# Patient Record
Sex: Female | Born: 1980 | Race: White | Hispanic: No | Marital: Married | Smoking: Never smoker
Health system: Southern US, Community
[De-identification: ages and names within clinical notes are randomized; demographics above are authoritative.]

## PROBLEM LIST (undated history)

## (undated) ENCOUNTER — Inpatient Hospital Stay: Payer: Self-pay

## (undated) DIAGNOSIS — D649 Anemia, unspecified: Secondary | ICD-10-CM

## (undated) DIAGNOSIS — N809 Endometriosis, unspecified: Secondary | ICD-10-CM

## (undated) DIAGNOSIS — K219 Gastro-esophageal reflux disease without esophagitis: Secondary | ICD-10-CM

## (undated) DIAGNOSIS — N83209 Unspecified ovarian cyst, unspecified side: Secondary | ICD-10-CM

## (undated) DIAGNOSIS — N946 Dysmenorrhea, unspecified: Secondary | ICD-10-CM

## (undated) DIAGNOSIS — N939 Abnormal uterine and vaginal bleeding, unspecified: Secondary | ICD-10-CM

## (undated) HISTORY — PX: TONSILLECTOMY AND ADENOIDECTOMY: SUR1326

## (undated) HISTORY — PX: NASAL TURBINATE REDUCTION: SHX2072

---

## 2003-12-27 ENCOUNTER — Other Ambulatory Visit: Payer: Self-pay

## 2004-04-05 ENCOUNTER — Emergency Department: Payer: Self-pay | Admitting: General Practice

## 2004-04-23 ENCOUNTER — Emergency Department: Payer: Self-pay | Admitting: General Practice

## 2004-12-20 ENCOUNTER — Ambulatory Visit: Payer: Self-pay | Admitting: Otolaryngology

## 2005-06-10 ENCOUNTER — Emergency Department: Payer: Self-pay | Admitting: Emergency Medicine

## 2005-07-11 ENCOUNTER — Ambulatory Visit: Payer: Self-pay | Admitting: Otolaryngology

## 2005-08-29 ENCOUNTER — Emergency Department: Payer: Self-pay | Admitting: Emergency Medicine

## 2005-09-14 ENCOUNTER — Emergency Department: Payer: Self-pay | Admitting: Emergency Medicine

## 2005-09-26 ENCOUNTER — Emergency Department: Payer: Self-pay | Admitting: Emergency Medicine

## 2006-01-31 ENCOUNTER — Emergency Department: Payer: Self-pay | Admitting: Emergency Medicine

## 2006-01-31 ENCOUNTER — Other Ambulatory Visit: Payer: Self-pay

## 2006-06-14 ENCOUNTER — Ambulatory Visit: Payer: Self-pay

## 2006-06-26 ENCOUNTER — Observation Stay: Payer: Self-pay

## 2006-08-07 ENCOUNTER — Inpatient Hospital Stay: Payer: Self-pay

## 2006-09-14 ENCOUNTER — Emergency Department: Payer: Self-pay | Admitting: Emergency Medicine

## 2008-04-14 ENCOUNTER — Emergency Department: Payer: Self-pay

## 2008-09-07 ENCOUNTER — Emergency Department: Payer: Self-pay | Admitting: Emergency Medicine

## 2009-03-15 HISTORY — PX: TUBAL LIGATION: SHX77

## 2009-03-24 ENCOUNTER — Ambulatory Visit: Payer: Self-pay

## 2009-03-25 ENCOUNTER — Inpatient Hospital Stay: Payer: Self-pay

## 2009-12-28 ENCOUNTER — Emergency Department: Payer: Self-pay | Admitting: Emergency Medicine

## 2010-08-07 ENCOUNTER — Emergency Department: Payer: Self-pay | Admitting: Emergency Medicine

## 2010-11-28 ENCOUNTER — Emergency Department: Payer: Self-pay | Admitting: Emergency Medicine

## 2011-02-22 ENCOUNTER — Emergency Department: Payer: Self-pay | Admitting: Emergency Medicine

## 2011-09-15 ENCOUNTER — Emergency Department: Payer: Self-pay | Admitting: Emergency Medicine

## 2012-01-27 ENCOUNTER — Emergency Department: Payer: Self-pay | Admitting: Emergency Medicine

## 2012-09-20 ENCOUNTER — Emergency Department: Payer: Self-pay | Admitting: Unknown Physician Specialty

## 2013-05-20 ENCOUNTER — Emergency Department: Payer: Self-pay | Admitting: Emergency Medicine

## 2016-03-27 ENCOUNTER — Emergency Department
Admission: EM | Admit: 2016-03-27 | Discharge: 2016-03-27 | Disposition: A | Discharge status: Home / Self Care | Payer: Self-pay | Attending: Emergency Medicine | Admitting: Emergency Medicine

## 2016-03-27 ENCOUNTER — Encounter: Payer: Self-pay | Admitting: Emergency Medicine

## 2016-03-27 ENCOUNTER — Emergency Department: Payer: Self-pay

## 2016-03-27 DIAGNOSIS — Y929 Unspecified place or not applicable: Secondary | ICD-10-CM | POA: Insufficient documentation

## 2016-03-27 DIAGNOSIS — Y9301 Activity, walking, marching and hiking: Secondary | ICD-10-CM | POA: Insufficient documentation

## 2016-03-27 DIAGNOSIS — W108XXA Fall (on) (from) other stairs and steps, initial encounter: Secondary | ICD-10-CM | POA: Insufficient documentation

## 2016-03-27 DIAGNOSIS — Y999 Unspecified external cause status: Secondary | ICD-10-CM | POA: Insufficient documentation

## 2016-03-27 DIAGNOSIS — S92355A Nondisplaced fracture of fifth metatarsal bone, left foot, initial encounter for closed fracture: Secondary | ICD-10-CM | POA: Insufficient documentation

## 2016-03-27 MED ORDER — MELOXICAM 15 MG PO TABS: 15.0000 mg | ORAL_TABLET | Freq: Every day | ORAL | 0 refills | Status: DC

## 2016-03-27 MED ORDER — TRAMADOL HCL 50 MG PO TABS: 50.0000 mg | ORAL_TABLET | Freq: Four times a day (QID) | ORAL | 0 refills | Status: DC | PRN

## 2016-03-27 NOTE — ED Provider Notes (Signed)
[facility] Emergency Department Provider Note ____________________________________________  Time seen: Approximately 8:45 AM  I have reviewed the triage vital signs and the nursing notes.   HISTORY  Chief Complaint Foot Injury    HPI Ashlee Curtis is a 35 y.o. female who presents to the emergency department for evaluation of foot pain. While walking down her deck stairs this morning, she missed the last 2 steps with her arms full and fell sideways. Her right foot stepped on her left as she fell into her trailer to prevent falling on the wet ground. She has pain on the top of her left foot near her pinky toe. She has taken 800mg  of ibuprofen.  History reviewed. No pertinent past medical history.  There are no active problems to display for this patient.   History reviewed. No pertinent surgical history.  Prior to Admission medications   Medication Sig Start Date End Date Taking? Authorizing Provider  meloxicam (MOBIC) 15 MG tablet Take 1 tablet (15 mg total) by mouth daily. 03/27/16   Victorino Dike, FNP  traMADol (ULTRAM) 50 MG tablet Take 1 tablet (50 mg total) by mouth every 6 (six) hours as needed. 03/27/16   Victorino Dike, FNP    Allergies Sulfa antibiotics  No family history on file.  Social History Social History  Substance Use Topics  . Smoking status: Never Smoker  . Smokeless tobacco: Never Used  . Alcohol use Not on file    Review of Systems Constitutional: No recent illness. Cardiovascular: Denies chest pain or palpitations. Respiratory: Denies shortness of breath. Musculoskeletal: Pain in left foot. Skin: Negative for rash, wound, lesion. Neurological: Negative for focal weakness or numbness.  ____________________________________________   PHYSICAL EXAM:  VITAL SIGNS: ED Triage Vitals  Enc Vitals Group     BP 03/27/16 0822 140/73     Pulse Rate 03/27/16 0822 79     Resp 03/27/16 0822 18     Temp 03/27/16 0822 98 F  (36.7 C)     Temp src --      SpO2 03/27/16 0822 99 %     Weight 03/27/16 0822 270 lb (122.5 kg)     Height 03/27/16 0822 5\' 3"  (1.6 m)     Head Circumference --      Peak Flow --      Pain Score 03/27/16 0823 7     Pain Loc --      Pain Edu? --      Excl. in [place]? --     Constitutional: Alert and oriented. Well appearing and in no acute distress. Eyes: Conjunctivae are normal. EOMI. Head: Atraumatic. Neck: No stridor.  Respiratory: Normal respiratory effort.   Musculoskeletal: Tenderness over the distal 5th metatarsal. No obvious deformity. Mild swelling noted.  Neurologic:  Normal speech and language. No gross focal neurologic deficits are appreciated. Speech is normal. No gait instability. Skin:  Skin is warm, dry and intact. Atraumatic. Psychiatric: Mood and affect are normal. Speech and behavior are normal.  ____________________________________________   LABS (all labs ordered are listed, but only abnormal results are displayed)  Labs Reviewed - No data to display ____________________________________________  RADIOLOGY  Possible nondisplaced fracture at the head of the left fifth metatarsal which correlates with point tenderness. I, Sherrie George, personally viewed and evaluated these images (plain radiographs) as part of my medical decision making, as well as reviewing the written report by the radiologist.  ____________________________________________   PROCEDURES  Procedure(s) performed: ACE and Post op  shoe applied by ER tech. Neurovascularly intact post application.   ____________________________________________   INITIAL IMPRESSION / ASSESSMENT AND PLAN / ED COURSE  Clinical Course     Pertinent labs & imaging results that were available during my care of the patient were reviewed by me and considered in my medical decision making (see chart for details).  Patient written Meloxicam and Tramadol. She is to follow up with podiatry for symptoms that do  not improve over the week. She is to avoid prolonged weight bearing. She is to return to the ER for symptoms that change or worsen if unable to schedule an appointment. ____________________________________________   FINAL CLINICAL IMPRESSION(S) / ED DIAGNOSES  Final diagnoses:  Closed nondisplaced fracture of fifth metatarsal bone of left foot, initial encounter       Victorino Dike, FNP 03/27/16 [place], MD 03/27/16 [phone]

## 2016-03-27 NOTE — ED Notes (Signed)
E-signature box not working. Pt verbalized understanding of discharge instructions and denied questions. 

## 2016-03-27 NOTE — ED Triage Notes (Signed)
Fell yesterday.  Pain to left foot.  Ambulates well.

## 2016-03-27 NOTE — ED Notes (Signed)
States she fell yesterday  Missed a step  Pain and swelling noted to top of foot  Positive pulses

## 2017-06-04 ENCOUNTER — Emergency Department
Admission: EM | Admit: 2017-06-04 | Discharge: 2017-06-04 | Disposition: A | Discharge status: Home / Self Care | Payer: 59 | Attending: Emergency Medicine | Admitting: Emergency Medicine

## 2017-06-04 ENCOUNTER — Encounter: Payer: Self-pay | Admitting: Emergency Medicine

## 2017-06-04 ENCOUNTER — Other Ambulatory Visit: Payer: Self-pay

## 2017-06-04 DIAGNOSIS — R51 Headache: Secondary | ICD-10-CM | POA: Diagnosis present

## 2017-06-04 DIAGNOSIS — H9203 Otalgia, bilateral: Secondary | ICD-10-CM | POA: Diagnosis not present

## 2017-06-04 DIAGNOSIS — J0140 Acute pansinusitis, unspecified: Secondary | ICD-10-CM | POA: Diagnosis not present

## 2017-06-04 MED ORDER — PREDNISONE 10 MG PO TABS: ORAL_TABLET | ORAL | 0 refills | Status: DC

## 2017-06-04 MED ORDER — FLUTICASONE PROPIONATE 50 MCG/ACT NA SUSP: 2.0000 | Freq: Every day | NASAL | 0 refills | Status: DC

## 2017-06-04 MED ORDER — AMOXICILLIN-POT CLAVULANATE 875-125 MG PO TABS: 1.0000 | ORAL_TABLET | Freq: Two times a day (BID) | ORAL | 0 refills | Status: AC

## 2017-06-04 NOTE — Discharge Instructions (Signed)
Follow-up with Dr. Kathyrn Sheriff at [facility] if any continued problems with her sinuses.  Begin taking Augmentin 875 twice daily for 10 days.  Prednisone taper dose over the next 6 days.  Also use Flonase nasal spray 2 sprays each nostril once daily.  Increase fluids.  Tylenol or ibuprofen if needed for facial pain.

## 2017-06-04 NOTE — ED Provider Notes (Signed)
[facility] Emergency Department Provider Note  ____________________________________________   First MD Initiated Contact with Patient 06/04/17 1120     (approximate)  I have reviewed the triage vital signs and the nursing notes.   HISTORY  Chief Complaint Facial Pain and Otalgia   HPI Ashlee Curtis is a 37 y.o. female 0 complaint of sinus drainage and congestion for 1 month.  She states for the last 2 weeks that she has had pressure on her right ear and popping in her left ear which has increased in intensity.  Patient took a prescription of amoxicillin the beginning of the month without any relief.  She is unaware of any fever but complains of pain especially to the left side of her face.  Patient has had problems with sinuses in the past.  She is also been taking over-the-counter decongestants without any relief.  She rates her pain is 7 out of 10.  History reviewed. No pertinent past medical history.  There are no active problems to display for this patient.   History reviewed. No pertinent surgical history.  Prior to Admission medications   Medication Sig Start Date End Date Taking? Authorizing Provider  amoxicillin-clavulanate (AUGMENTIN) 875-125 MG tablet Take 1 tablet by mouth 2 (two) times daily for 10 days. 06/04/17 06/14/17  Johnn Hai, PA-C  fluticasone (FLONASE) 50 MCG/ACT nasal spray Place 2 sprays into both nostrils daily. 06/04/17 06/04/18  Johnn Hai, PA-C  predniSONE (DELTASONE) 10 MG tablet Take 6 tablets  today, on day 2 take 5 tablets, day 3 take 4 tablets, day 4 take 3 tablets, day 5 take  2 tablets and 1 tablet the last day 06/04/17   Johnn Hai, PA-C    Allergies Sulfa antibiotics  No family history on file.  Social History Social History   Tobacco Use  . Smoking status: Never Smoker  . Smokeless tobacco: Never Used  Substance Use Topics  . Alcohol use: Not on file  . Drug use: Not on file    Review of  Systems Constitutional: No fever/chills Eyes: No visual changes. ENT: Positive bilateral ear pain.  Positive sinus pain and pressure. Cardiovascular: Denies chest pain. Respiratory: Denies shortness of breath. Gastrointestinal: No abdominal pain.  No nausea, no vomiting.  Musculoskeletal: Negative for back pain. Neurological: Negative for headaches, focal weakness or numbness. ___________________________________________   PHYSICAL EXAM:  VITAL SIGNS: ED Triage Vitals [06/04/17 1106]  Enc Vitals Group     BP 131/73     Pulse Rate 87     Resp 20     Temp 98.3 F (36.8 C)     Temp Source Oral     SpO2 97 %     Weight 275 lb (124.7 kg)     Height 5\' 3"  (1.6 m)     Head Circumference      Peak Flow      Pain Score 7     Pain Loc      Pain Edu?      Excl. in [place]?    Constitutional: Alert and oriented. Well appearing and in no acute distress. Eyes: Conjunctivae are normal.  Head: Atraumatic. Nose: No congestion/rhinnorhea.  TMs are dull bilaterally without erythema but poor light reflex and fluid noted to the left TM. Mouth/Throat: Mucous membranes are moist.  Oropharynx non-erythematous.  Moderate posterior drainage noted. Neck: No stridor.   Hematological/Lymphatic/Immunilogical: No cervical lymphadenopathy. Cardiovascular: Normal rate, regular rhythm. Grossly normal heart sounds.  Good peripheral  circulation. Respiratory: Normal respiratory effort.  No retractions. Lungs CTAB. Musculoskeletal: Moves upper and lower extremities without any difficulty.  Normal gait was noted. Neurologic:  Normal speech and language. No gross focal neurologic deficits are appreciated. Skin:  Skin is warm, dry and intact.  Psychiatric: Mood and affect are normal. Speech and behavior are normal.  ____________________________________________   LABS (all labs ordered are listed, but only abnormal results are displayed)  Labs Reviewed - No data to display   PROCEDURES  Procedure(s)  performed: None  Procedures  Critical Care performed: No  ____________________________________________   INITIAL IMPRESSION / ASSESSMENT AND PLAN / ED COURSE Patient was placed on Augmentin 875 twice daily for 10 days along with prednisone 60 mg 6-day taper and Flonase nasal spray.  Patient is to follow-up with [facility] if any continued problems with her sinuses.  She is encouraged to drink lots of fluids and also take Tylenol if needed for facial pain.  ____________________________________________   FINAL CLINICAL IMPRESSION(S) / ED DIAGNOSES  Final diagnoses:  Acute pansinusitis, recurrence not specified     ED Discharge Orders        Ordered    amoxicillin-clavulanate (AUGMENTIN) 875-125 MG tablet  2 times daily     06/04/17 1153    predniSONE (DELTASONE) 10 MG tablet     06/04/17 1153    fluticasone (FLONASE) 50 MCG/ACT nasal spray  Daily     06/04/17 1153       Note:  This document was prepared using Dragon voice recognition software and may include unintentional dictation errors.    Johnn Hai, PA-C 06/04/17 1210    Nena Polio, MD 06/04/17 [phone]

## 2017-06-04 NOTE — ED Triage Notes (Signed)
Sinus congestion and drainage x 1 month. L ear popping x 2 weeks.

## 2018-07-30 ENCOUNTER — Other Ambulatory Visit: Payer: Self-pay

## 2018-07-30 ENCOUNTER — Emergency Department
Admission: EM | Admit: 2018-07-30 | Discharge: 2018-07-30 | Disposition: A | Discharge status: Home / Self Care | Payer: No Typology Code available for payment source | Attending: Emergency Medicine | Admitting: Emergency Medicine

## 2018-07-30 DIAGNOSIS — B9789 Other viral agents as the cause of diseases classified elsewhere: Secondary | ICD-10-CM | POA: Diagnosis not present

## 2018-07-30 DIAGNOSIS — J029 Acute pharyngitis, unspecified: Secondary | ICD-10-CM

## 2018-07-30 DIAGNOSIS — J028 Acute pharyngitis due to other specified organisms: Secondary | ICD-10-CM | POA: Diagnosis not present

## 2018-07-30 DIAGNOSIS — J040 Acute laryngitis: Secondary | ICD-10-CM | POA: Insufficient documentation

## 2018-07-30 LAB — GROUP A STREP BY PCR: Group A Strep by PCR: NOT DETECTED

## 2018-07-30 MED ORDER — DIPHENHYDRAMINE HCL 12.5 MG/5ML PO ELIX
12.5000 mg | ORAL_SOLUTION | Freq: Once | ORAL | Status: AC
  Administered 2018-07-30: 12.5 mg via ORAL
  Filled 2018-07-30: qty 5

## 2018-07-30 MED ORDER — METHYLPREDNISOLONE 4 MG PO TBPK: ORAL_TABLET | ORAL | 0 refills | Status: DC

## 2018-07-30 MED ORDER — LIDOCAINE VISCOUS HCL 2 % MT SOLN
15.0000 mL | Freq: Once | OROMUCOSAL | Status: AC
  Administered 2018-07-30: 15 mL via OROMUCOSAL
  Filled 2018-07-30: qty 15

## 2018-07-30 MED ORDER — PREDNISONE 20 MG PO TABS
60.0000 mg | ORAL_TABLET | Freq: Once | ORAL | Status: AC
Start: 2018-07-30 — End: 2018-07-30
  Administered 2018-07-30: 60 mg via ORAL
  Filled 2018-07-30: qty 3

## 2018-07-30 MED ORDER — MAGIC MOUTHWASH W/LIDOCAINE: 5.0000 mL | Freq: Four times a day (QID) | ORAL | 0 refills | Status: DC

## 2018-07-30 NOTE — ED Triage Notes (Signed)
Pt arrived via POV with sore throat and fever last night. Pt states that she has had her tonsills removed due to recurrent strep and it feels the same. Pt took tylenol before coming.

## 2018-07-30 NOTE — ED Notes (Signed)
See triage note  Presents with sore throat   States she did have fever at home  Took some tylenol PTA  Afebrile on arrival  States pain is mainly on the left and radiates into left ear

## 2018-07-30 NOTE — ED Provider Notes (Signed)
[facility] Emergency Department Provider Note   ____________________________________________   First MD Initiated Contact with Patient 07/30/18 [phone]     (approximate)  I have reviewed the triage vital signs and the nursing notes.   HISTORY  Chief Complaint Sore Throat    HPI Ashlee Curtis is a 38 y.o. female patient claims sore throat fever began last night.  Patient states she had a history of recurrent strep pharyngitis which resolved after the tonsillectomy.  Patient said her complaint feels the same.  Patient rates the pain as a 7/10.  Patient described the pain is "sore".  Patient state can tolerate fluids but had trouble with semisolid and solid foods.  Patient also complained of decreased voice volume.  No palliative measure for complaint.   Patient denies other URI signs and symptoms.   No past medical history on file.  There are no active problems to display for this patient.   No past surgical history on file.  Prior to Admission medications   Medication Sig Start Date End Date Taking? Authorizing Provider  magic mouthwash w/lidocaine SOLN Take 5 mLs by mouth 4 (four) times daily. 07/30/18   Sable Feil, PA-C  methylPREDNISolone (MEDROL DOSEPAK) 4 MG TBPK tablet Take Tapered dose as directed 07/30/18   Sable Feil, PA-C    Allergies Sulfa antibiotics  No family history on file.  Social History Social History   Tobacco Use  . Smoking status: Never Smoker  . Smokeless tobacco: Never Used  Substance Use Topics  . Alcohol use: Not on file  . Drug use: Not on file    Review of Systems Constitutional: No fever/chills Eyes: No visual changes. ENT: Sore throat.   Cardiovascular: Denies chest pain. Respiratory: Denies shortness of breath. Gastrointestinal: No abdominal pain.  No nausea, no vomiting.  No diarrhea.  No constipation. Genitourinary: Negative for dysuria. Musculoskeletal: Negative for back pain. Skin: Negative  for rash. Neurological: Negative for headaches, focal weakness or numbness. Allergic/Immunilogical: Sulfa antibiotics ____________________________________________   PHYSICAL EXAM:  VITAL SIGNS: ED Triage Vitals  Enc Vitals Group     BP 07/30/18 0910 140/74     Pulse Rate 07/30/18 0910 79     Resp 07/30/18 0910 16     Temp 07/30/18 0910 98.4 F (36.9 C)     Temp Source 07/30/18 0910 Oral     SpO2 07/30/18 0910 98 %     Weight 07/30/18 0911 264 lb (119.7 kg)     Height 07/30/18 0911 5\' 3"  (1.6 m)     Head Circumference --      Peak Flow --      Pain Score 07/30/18 0910 7     Pain Loc --      Pain Edu? --      Excl. in [place]? --     Constitutional: Alert and oriented. Well appearing and in no acute distress. Eyes: Conjunctivae are normal. PERRL. EOMI. Head: Atraumatic. Nose: No congestion/rhinnorhea. Mouth/Throat: Mucous membranes are moist.  Oropharynx erythematous.  Decreased voice volume. Neck: No stridor.   Hematological/Lymphatic/Immunilogical: No cervical lymphadenopathy. Cardiovascular: Normal rate, regular rhythm. Grossly normal heart sounds.  Good peripheral circulation. Respiratory: Normal respiratory effort.  No retractions. Lungs CTAB. Neurologic:  Normal speech and language. No gross focal neurologic deficits are appreciated. No gait instability. Skin:  Skin is warm, dry and intact. No rash noted. Psychiatric: Mood and affect are normal. Speech and behavior are normal.  ____________________________________________   LABS (all labs ordered  are listed, but only abnormal results are displayed)  Labs Reviewed  GROUP A STREP BY PCR   ____________________________________________  EKG   ____________________________________________  RADIOLOGY  ED MD interpretation:    Official radiology report(s): No results found.  ____________________________________________   PROCEDURES  Procedure(s) performed (including Critical Care):  Procedures    ____________________________________________   INITIAL IMPRESSION / ASSESSMENT AND PLAN / ED COURSE  As part of my medical decision making, I reviewed the following data within the [place]         Patient presents with sore throat and decreased voice volume.  Patient is negative strep pharyngitis.  Patient exam is consistent with viral pharyngitis and laryngitis.  Patient given discharge care instruction work note.  Patient advised take medication as directed.  Patient advised [facility]-up [facility] condition persist.      ____________________________________________   FINAL CLINICAL IMPRESSION(S) / ED DIAGNOSES  Final diagnoses:  Viral pharyngitis  Laryngitis     ED Discharge Orders         Ordered    magic mouthwash w/lidocaine SOLN  4 times daily     07/30/18 1018    methylPREDNISolone (MEDROL DOSEPAK) 4 MG TBPK tablet     07/30/18 1018           Note:  This document was prepared using Dragon voice recognition software and may include unintentional dictation errors.    Sable Feil, PA-C 07/30/18 [place], [place], MD 07/30/18 [phone]

## 2020-07-19 ENCOUNTER — Other Ambulatory Visit: Payer: Self-pay

## 2020-07-20 LAB — CBC WITH DIFFERENTIAL/PLATELET
Basophils Absolute: 0 10*3/uL (ref 0.0–0.2)
Basos: 0 %
EOS (ABSOLUTE): 0.1 10*3/uL (ref 0.0–0.4)
Eos: 1 %
Hematocrit: 38.7 % (ref 34.0–46.6)
Hemoglobin: 13 g/dL (ref 11.1–15.9)
Immature Grans (Abs): 0 10*3/uL (ref 0.0–0.1)
Immature Granulocytes: 0 %
Lymphocytes Absolute: 2.2 10*3/uL (ref 0.7–3.1)
Lymphs: 33 %
MCH: 29.6 pg (ref 26.6–33.0)
MCHC: 33.6 g/dL (ref 31.5–35.7)
MCV: 88 fL (ref 79–97)
Monocytes Absolute: 0.3 10*3/uL (ref 0.1–0.9)
Monocytes: 5 %
Neutrophils Absolute: 4.1 10*3/uL (ref 1.4–7.0)
Neutrophils: 61 %
Platelets: 212 10*3/uL (ref 150–450)
RBC: 4.39 x10E6/uL (ref 3.77–5.28)
RDW: 12.8 % (ref 11.7–15.4)
WBC: 6.7 10*3/uL (ref 3.4–10.8)

## 2021-09-05 ENCOUNTER — Other Ambulatory Visit: Payer: Self-pay

## 2021-09-05 ENCOUNTER — Emergency Department: Payer: Managed Care, Other (non HMO)

## 2021-09-05 ENCOUNTER — Observation Stay
Admission: EM | Admit: 2021-09-05 | Discharge: 2021-09-07 | Disposition: A | Discharge status: Home / Self Care | Payer: Managed Care, Other (non HMO) | Attending: Obstetrics and Gynecology | Admitting: Obstetrics and Gynecology

## 2021-09-05 DIAGNOSIS — R1031 Right lower quadrant pain: Secondary | ICD-10-CM | POA: Diagnosis not present

## 2021-09-05 DIAGNOSIS — N939 Abnormal uterine and vaginal bleeding, unspecified: Secondary | ICD-10-CM | POA: Diagnosis not present

## 2021-09-05 DIAGNOSIS — K661 Hemoperitoneum: Principal | ICD-10-CM | POA: Insufficient documentation

## 2021-09-05 DIAGNOSIS — N83201 Unspecified ovarian cyst, right side: Principal | ICD-10-CM | POA: Diagnosis present

## 2021-09-05 DIAGNOSIS — N836 Hematosalpinx: Secondary | ICD-10-CM | POA: Diagnosis present

## 2021-09-05 DIAGNOSIS — R102 Pelvic and perineal pain: Secondary | ICD-10-CM | POA: Diagnosis present

## 2021-09-05 DIAGNOSIS — R1084 Generalized abdominal pain: Secondary | ICD-10-CM | POA: Diagnosis not present

## 2021-09-05 HISTORY — DX: Unspecified ovarian cyst, unspecified side: N83.209

## 2021-09-05 LAB — URINALYSIS, ROUTINE W REFLEX MICROSCOPIC
Bacteria, UA: NONE SEEN
RBC / HPF: >50 RBC/hpf — ABNORMAL HIGH (ref 0–5)
Specific Gravity, Urine: 1.027 (ref 1.005–1.030)
Squamous Epithelial / LPF: NONE SEEN (ref 0–5)
WBC, UA: >50 WBC/hpf — ABNORMAL HIGH (ref 0–5)

## 2021-09-05 LAB — CBC
HCT: 36.9 % (ref 36.0–46.0)
Hemoglobin: 12.4 g/dL (ref 12.0–15.0)
MCH: 28.6 pg (ref 26.0–34.0)
MCHC: 33.6 g/dL (ref 30.0–36.0)
MCV: 85 fL (ref 80.0–100.0)
Platelets: 236 10*3/uL (ref 150–400)
RBC: 4.34 MIL/uL (ref 3.87–5.11)
RDW: 12.9 % (ref 11.5–15.5)
WBC: 8.1 10*3/uL (ref 4.0–10.5)
nRBC: 0 % (ref 0.0–0.2)

## 2021-09-05 LAB — COMPREHENSIVE METABOLIC PANEL
ALT: 19 U/L (ref 0–44)
AST: 15 U/L (ref 15–41)
Albumin: 3.8 g/dL (ref 3.5–5.0)
Alkaline Phosphatase: 54 U/L (ref 38–126)
Anion gap: 9 (ref 5–15)
BUN: 16 mg/dL (ref 6–20)
CO2: 26 mmol/L (ref 22–32)
Calcium: 8.9 mg/dL (ref 8.9–10.3)
Chloride: 104 mmol/L (ref 98–111)
Creatinine, Ser: 0.99 mg/dL (ref 0.44–1.00)
GFR, Estimated: >60 mL/min (ref >60)
Glucose, Bld: 166 mg/dL — ABNORMAL HIGH (ref 70–99)
Potassium: 3.4 mmol/L — ABNORMAL LOW (ref 3.5–5.1)
Sodium: 139 mmol/L (ref 135–145)
Total Bilirubin: 0.4 mg/dL (ref 0.3–1.2)
Total Protein: 6.9 g/dL (ref 6.5–8.1)

## 2021-09-05 LAB — POC URINE PREG, ED: Preg Test, Ur: NEGATIVE

## 2021-09-05 LAB — TYPE AND SCREEN
ABO/RH(D): A NEG
Antibody Screen: NEGATIVE

## 2021-09-05 LAB — LIPASE, BLOOD: Lipase: 34 U/L (ref 11–51)

## 2021-09-05 LAB — HCG, QUANTITATIVE, PREGNANCY: hCG, Beta Chain, Quant, S: <1 m[IU]/mL (ref <5)

## 2021-09-05 MED ORDER — HYDROMORPHONE HCL 1 MG/ML IJ SOLN
0.2000 mg | INTRAMUSCULAR | Status: DC | PRN
  Administered 2021-09-05 – 2021-09-06 (×4): 0.6 mg via INTRAVENOUS
  Filled 2021-09-05 (×4): qty 1

## 2021-09-05 MED ORDER — IOHEXOL 300 MG/ML  SOLN
100.0000 mL | Freq: Once | INTRAMUSCULAR | Status: AC | PRN
  Administered 2021-09-05: 100 mL via INTRAVENOUS

## 2021-09-05 MED ORDER — HYDROMORPHONE HCL 1 MG/ML IJ SOLN
0.5000 mg | Freq: Once | INTRAMUSCULAR | Status: AC
  Administered 2021-09-05: 0.5 mg via INTRAVENOUS
  Filled 2021-09-05: qty 1

## 2021-09-05 MED ORDER — ZOLPIDEM TARTRATE 5 MG PO TABS: 5.0000 mg | ORAL_TABLET | Freq: Every evening | ORAL | Status: DC | PRN

## 2021-09-05 MED ORDER — PRENATAL MULTIVITAMIN CH
1.0000 | ORAL_TABLET | Freq: Every day | ORAL | Status: DC
  Administered 2021-09-07: 1 via ORAL
  Filled 2021-09-05: qty 1

## 2021-09-05 MED ORDER — NORETHINDRONE ACETATE 5 MG PO TABS
5.0000 mg | ORAL_TABLET | Freq: Four times a day (QID) | ORAL | Status: DC
  Administered 2021-09-05 – 2021-09-06 (×2): 5 mg via ORAL
  Filled 2021-09-05 (×3): qty 1

## 2021-09-05 MED ORDER — FENTANYL CITRATE PF 50 MCG/ML IJ SOSY
50.0000 ug | PREFILLED_SYRINGE | INTRAMUSCULAR | Status: DC | PRN
  Administered 2021-09-05: 50 ug via INTRAVENOUS
  Filled 2021-09-05: qty 1

## 2021-09-05 MED ORDER — DEXTROSE IN LACTATED RINGERS 5 % IV SOLN: INTRAVENOUS | Status: DC

## 2021-09-05 MED ORDER — HYDROMORPHONE HCL 1 MG/ML IJ SOLN
1.0000 mg | Freq: Once | INTRAMUSCULAR | Status: AC
  Administered 2021-09-05: 1 mg via INTRAVENOUS
  Filled 2021-09-05: qty 1

## 2021-09-05 MED ORDER — IBUPROFEN 800 MG PO TABS
800.0000 mg | ORAL_TABLET | Freq: Three times a day (TID) | ORAL | Status: DC | PRN
  Administered 2021-09-06 – 2021-09-07 (×4): 800 mg via ORAL
  Filled 2021-09-05 (×4): qty 1

## 2021-09-05 MED ORDER — SODIUM CHLORIDE 0.9 % IV BOLUS
1000.0000 mL | Freq: Once | INTRAVENOUS | Status: AC
Start: 2021-09-05 — End: 2021-09-05
  Administered 2021-09-05: 1000 mL via INTRAVENOUS

## 2021-09-05 NOTE — ED Notes (Signed)
Pt oob to toilet with assist. ?

## 2021-09-05 NOTE — ED Triage Notes (Signed)
Pt to ED [facility] brought from work. ? ?Pt has had heavy vaginal bleeding since last night and severe sudden onset bilateral abdominal/pelvic pain that started at 11am today, bilateral. Has been changing ultra tampon and menstural pad every 45 minutes today and [facility] states when they picked pt up there were 2 golf ball size clots in toilet. ? ?Pt has hx ovarian cysts and has had 4 c/s, states wants uterus out but Obs won't do surgery because she is overweight and states she has "lots of scar tissue" from the 4 c/s.  ? ?Pt appears to be in severe pain, states that pain is generalized to whole abdomen, "squeezing like a vice grip". Received 15mg fentanyl en route via 18g IV to R AC. ? ?EMS VS: HR 82, 98% RA, RR 26, 145/81, CBG 209.  ? ? ?

## 2021-09-05 NOTE — H&P (Signed)
? ? ?    ADMIT NOTE ? ?HPI: ?     [name] is a 41 y.o. [id number] who LMP was Patient's last menstrual period was 09/04/2021 (exact date).. ? ?Subjective: ?She presents today to the emergency department with acute onset of midline and right-sided abdominal pain.  She states that she always has severe pain with her menses and her menses started yesterday.  She says it has been especially heavy over the last 2 days and she has also passed some clots.  She does report that her pain seems much more than usual and that in addition to her midline pain she has significant right lower quadrant pain. ?She denies fevers chills vaginal discharge or other symptoms. ? ?In the emergency department she received pain relief with fentanyl and Dilaudid which worked for short periods of time. ? ?        HISTORY ?Allergies  ?Allergen Reactions  ? Sulfa Antibiotics Hives  ? ? ?OB History ? ?OB History  ?Gravida Para Term Preterm AB Living  ?'5 4 4     4  '$ ?SAB IAB Ectopic Multiple Live Births  ?           ?  ?# Outcome Date GA Lbr Len/2nd Weight Sex Delivery Anes PTL Lv  ?5 Gravida           ?4 Term           ?3 Term           ?2 Term           ?1 Term           ?  ?Obstetric Comments  ?All 4 were  cesareans  ? ? ?Past Medical History ? ?Past Medical History:  ?Diagnosis Date  ? Ovarian cyst   ? ? ?Past Surgical History ? ?Past Surgical History:  ?Procedure Laterality Date  ? CESAREAN SECTION    ? 4 cesareans  ?   ? ?Past Social History: ? ?Social History  ? ?Socioeconomic History  ? Marital status: Married  ?  Spouse name: Not on file  ? Number of children: Not on file  ? Years of education: Not on file  ? Highest education level: Not on file  ?Occupational History  ? Not on file  ?Tobacco Use  ? Smoking status: Never  ? Smokeless tobacco: Never  ?Substance and Sexual Activity  ? Alcohol use: Not Currently  ? Drug use: Not on file  ? Sexual activity: Not Currently  ?Other Topics Concern  ? Not on file  ?Social History Narrative   ? Not on file  ? ?Social Determinants of Health  ? ?Financial Resource Strain: Not on file  ?Food Insecurity: Not on file  ?Transportation Needs: Not on file  ?Physical Activity: Not on file  ?Stress: Not on file  ?Social Connections: Not on file  ? ? ?Family History ? ?History reviewed. No pertinent family history. ? ? ROS: ?Constitutional: Denied constitutional symptoms, night sweats, recent illness, fatigue, fever, insomnia and weight loss.  ?Eyes: Denied eye symptoms, eye pain, photophobia, vision change and visual disturbance.  ?Ears/Nose/Throat/Neck: Denied ear, nose, throat or neck symptoms, hearing loss, nasal discharge, sinus congestion and sore throat.  ?Cardiovascular: Denied cardiovascular symptoms, arrhythmia, chest pain/pressure, edema, exercise intolerance, orthopnea and palpitations.  ?Respiratory: Denied pulmonary symptoms, asthma, pleuritic pain, productive sputum, cough, dyspnea and wheezing.  ?Gastrointestinal: Denied, gastro-esophageal reflux, melena, nausea and vomiting.  ?Genitourinary: Denied genitourinary symptoms including symptomatic vaginal discharge,  pelvic relaxation issues, and urinary complaints.  ?Musculoskeletal: Denied musculoskeletal symptoms, stiffness, swelling, muscle weakness and myalgia.  ?Dermatologic: Denied dermatology symptoms, rash and scar.  ?Neurologic: Denied neurology symptoms, dizziness, headache, neck pain and syncope.  ?Psychiatric: Denied psychiatric symptoms, anxiety and depression.  ?Endocrine: Denied endocrine symptoms including hot flashes and night sweats.  ? ?Medications   ? ?Patient's Medications  ?New Prescriptions  ? No medications on file  ?Previous Medications  ? MAGIC MOUTHWASH W/LIDOCAINE SOLN    Take 5 mLs by mouth 4 (four) times daily.  ? METHYLPREDNISOLONE (MEDROL DOSEPAK) 4 MG TBPK TABLET    Take Tapered dose as directed  ?Modified Medications  ? No medications on file  ?Discontinued Medications  ? No medications on file   ? ? ? ?Objective: ?Vitals:  ? 09/05/21 2000 09/05/21 2030  ?BP: (!) 156/94 (!) 146/82  ?Pulse: (!) 101 87  ?Resp: (!) 23 14  ?Temp:  98.9 ?F (37.2 ?C)  ?SpO2: 100% 96%  ?  ? ? ?HEENT: Grossly within normal limits.  Normo-cephalic.  Neck Supple.  Pupils reactive.  ?Thyroid Smooth without nodularity or enlargement.  ?Skin No rashes, lesions or ulceration  ?Breasts: No masses or discharge.  Symmetric.  No axillary adenopathy.  ?Lungs: Clear to auscultation.  No rales or wheezes.  ?Heart: NSR.  No murmurs or rubs appreciated.  ?Abdomen: Soft.  Non-tender.  No masses.  No HSM.  ?Extremities: Moves all appropriately.  Normal ROM for age.  ?Neuro: Oriented to PPT.  Normal mood.  ? ?        CT: ?  Reproductive: Asymmetric enlargement of RIGHT ovary 6.6 x 3.6 x 4.3 ?cm. LEFT ovary normal appearance. Nonspecific low-attenuation ?centrally in lower uterine segment/cervix. No discrete uterine mass. ?  ?Other: Trace free fluid in pelvis and minimally RIGHT pericolic ?gutter. Tiny umbilical hernia containing fat. No free air. ?  ?Musculoskeletal: Osseous structures unremarkable. ?  ?IMPRESSION: ?Asymmetric enlargement of the RIGHT ovary, cannot exclude mass; ?pelvic sonography recommended to assess. ? ?[place]: ?        1. Endometrial thickness of 14.1 mm. If bleeding remains ?unresponsive to hormonal or medical therapy, sonohysterogram should ?be considered for focal lesion work-up. (Ref: Radiological ?Reasoning: Algorithmic Workup of Abnormal Vaginal Bleeding with ?Endovaginal Sonography and Sonohysterography. AJR 2008; [id number]:N46-27) ?2. The ovaries were not able to be visualized secondary to body ?habitus and shadowing from bowel gas  ? ? ?ASSESSMENT:  ?1.  Generalized abdominal pain ? Dysmenorrhea vs Ovarian torsion ? ?PLAN: ?1.  Treat vaginal bleeding with Aygestin ?2.   Pain relief ?3.   Observation  - check condition in AM ? Pt completed childbearing and wants ovary removed eventually ? ?Discussed management in detail with pt  and she agrees. ? ?I spent 45 minutes involved in the care of this patient preparing to see the patient by obtaining and reviewing her medical history (including labs, imaging tests and prior procedures), documenting clinical information in the electronic health record (EHR), counseling and coordinating care plans, writing and sending prescriptions, ordering tests or procedures and in direct communicating with the patient and medical staff discussing pertinent items from her history and physical exam. ? ? ?Jeannie Fend ,MD ?09/05/2021,8:48 PM ?    ?     ? ?

## 2021-09-05 NOTE — ED Provider Notes (Signed)
? ?[facility] ?Provider Note ? ? ? Event Date/Time  ? First MD Initiated Contact with Patient 09/05/21 1414   ?  (approximate) ? ? ?History  ? ?Abdominal Pain and Vaginal Bleeding ? ? ?HPI ? ?Ashlee Curtis is a 41 y.o. female who presents to the ED for evaluation of Abdominal Pain and Vaginal Bleeding ?  ?I review OB/GYN clinic visit from 2021.  History of menorrhagia right-sided ovarian cyst, 2.5 cm in size on remote ultrasounds.  G4 P4 s/p tubal ligation.  She has had 4 cesarean sections in the past. ? ?Patient presents to the ED due to severe generalized abdominal pain.  She reports starting her normal menstrual period yesterday with typical flow, then developing rapid onset and severe abdominal pain this morning around 11 AM.  Reports the pain is generalized but seems more localized to her lower abdomen bilaterally. ? ?Reports a "normal" bowel movement this morning without emesis, dysuria or other symptoms beyond pain. ? ? ?Physical Exam  ? ?Triage Vital Signs: ?ED Triage Vitals  ?Enc Vitals Group  ?   BP 09/05/21 1244 (!) 148/71  ?   Pulse Rate 09/05/21 1244 81  ?   Resp 09/05/21 1244 (!) 28  ?   Temp 09/05/21 1244 98.1 ?F (36.7 ?C)  ?   Temp Source 09/05/21 1244 Oral  ?   SpO2 09/05/21 1244 100 %  ?   Weight 09/05/21 1246 283 lb (128.4 kg)  ?   Height 09/05/21 1246 '5\' 3"'$  (1.6 m)  ?   Head Circumference --   ?   Peak Flow --   ?   Pain Score 09/05/21 1245 10  ?   Pain Loc --   ?   Pain Edu? --   ?   Excl. in [place]? --   ? ? ?Most recent vital signs: ?Vitals:  ? 09/05/21 1244 09/05/21 1423  ?BP: (!) 148/71 (!) 167/82  ?Pulse: 81 (!) 121  ?Resp: (!) 28 17  ?Temp: 98.1 ?F (36.7 ?C)   ?SpO2: 100% 100%  ? ? ?General: Awake.  Morbidly obese.  Rolling around in the bed and obviously uncomfortable, crying in pain.  After Dilaudid, more tolerant of examination and conversational. ?CV:  Good peripheral perfusion.  Tachycardic and regular. ?Resp:  Normal effort.  ?Abd:  No distention.  Diffuse  tenderness with voluntary guarding throughout making examination difficult. ?MSK:  No deformity noted.  ?Neuro:  No focal deficits appreciated. ?Other:   ? ? ?ED Results / Procedures / Treatments  ? ?Labs ?(all labs ordered are listed, but only abnormal results are displayed) ?Labs Reviewed  ?COMPREHENSIVE METABOLIC PANEL - Abnormal; Notable for the following components:  ?    Result Value  ? Potassium 3.4 (*)   ? Glucose, Bld 166 (*)   ? All other components within normal limits  ?LIPASE, BLOOD  ?CBC  ?URINALYSIS, ROUTINE W REFLEX MICROSCOPIC  ?HCG, QUANTITATIVE, PREGNANCY  ?POC URINE PREG, ED  ?TYPE AND SCREEN  ? ? ?EKG ? ? ?RADIOLOGY ? ? ?Official radiology report(s): ?No results found. ? ?PROCEDURES and INTERVENTIONS: ? ?Procedures ? ?Medications  ?fentaNYL (SUBLIMAZE) injection 50 mcg (50 mcg Intravenous Given 09/05/21 1256)  ?sodium chloride 0.9 % bolus 1,000 mL (0 mLs Intravenous Stopped 09/05/21 1339)  ?HYDROmorphone (DILAUDID) injection 1 mg (1 mg Intravenous Given 09/05/21 1425)  ? ? ? ?IMPRESSION / MDM / ASSESSMENT AND PLAN / ED COURSE  ?I reviewed the triage vital signs and the nursing notes. ? ?  41 year old female s/p tubal ligation and x4 cesarean sections presents to the ED with severe abdominal pain in the setting of her menstrual period.  She is tachycardic and obviously uncomfortable, but remains hemodynamically stable.  Blood work is benign with no leukocytosis, renal dysfunction or signs of pancreatitis.  Quite tender on examination and does not tolerate abdominal exam very well.  She is tender all over, but does have more pronounced tenderness throughout her lower abdomen bilaterally without further localizing features.  Concern for SBO versus ovarian torsion primarily, less likely acute cystitis, diverticulitis, appendicitis or renal stone. Both ultrasound and CT scan of her abdomen/pelvis are pending at the time of signout. ? ?Clinical Course as of 09/05/21 1520  ?Mon Sep 05, 2021  ?1453  Reassessed and reexamined.  More comfortable after the Dilaudid and tolerated exam better, still quite tender throughout. [DS]  ?1520 Patient signed out to oncoming provider pending imaging to evaluate for her abdominal pain. [DS]  ?  ?Clinical Course User Index ?[DS] Vladimir Crofts, MD  ? ? ? ?FINAL CLINICAL IMPRESSION(S) / ED DIAGNOSES  ? ?Final diagnoses:  ?Generalized abdominal pain  ? ? ? ?Rx / DC Orders  ? ?ED Discharge Orders   ? ? None  ? ?  ? ? ? ?Note:  This document was prepared using Dragon voice recognition software and may include unintentional dictation errors. ?  ?Vladimir Crofts, MD ?09/05/21 1522 ? ?

## 2021-09-05 NOTE — ED Provider Notes (Signed)
----------------------------------------- ?  3:13 PM on 09/05/2021 ?----------------------------------------- ? ?Blood pressure (!) 167/82, pulse (!) 121, temperature 98.1 ?F (36.7 ?C), temperature source Oral, resp. rate 17, height '5\' 3"'$  (1.6 m), weight 128.4 kg, last menstrual period 09/04/2021, SpO2 100 %. ? ?Assuming care from Dr. Tamala Julian.  In short, Ashlee Curtis is a 41 y.o. female with a chief complaint of Abdominal Pain and Vaginal Bleeding ?Marland Kitchen  Refer to the original H&P for additional details. ? ?The current plan of care is to follow-up US and CT imaging of abdomen for sudden onset severe lower abdominal pain with vaginal bleeding. ? ?----------------------------------------- ?8:00 PM on 09/05/2021 ?----------------------------------------- ?CT scan of abdomen/pelvis is remarkable only for enlargement of right ovary with small amount of free fluid in the pelvis, no evidence of appendicitis or other process to explain patient's pain.  Ultrasound was obtained to assess for torsion, however this was unable to visualize either ovary due to body habitus and bowel gas.  Suspicion remains high for torsion given significant pain on reassessment, we will treat additional pain with IV Dilaudid.  Case discussed with Dr. Amalia Hailey of OB/GYN, who has evaluated the patient and will admit for pain control and potential operative intervention as warranted. ? ?  ?Blake Divine, MD ?09/05/21 2001 ? ?

## 2021-09-06 ENCOUNTER — Observation Stay: Payer: Managed Care, Other (non HMO) | Admitting: Certified Registered Nurse Anesthetist

## 2021-09-06 ENCOUNTER — Other Ambulatory Visit: Payer: Self-pay

## 2021-09-06 ENCOUNTER — Encounter: Payer: Self-pay | Admitting: Obstetrics and Gynecology

## 2021-09-06 ENCOUNTER — Encounter
Admission: EM | Disposition: A | Discharge status: Home / Self Care | Payer: Self-pay | Source: Home / Self Care | Attending: Emergency Medicine

## 2021-09-06 DIAGNOSIS — N83511 Torsion of right ovary and ovarian pedicle: Secondary | ICD-10-CM

## 2021-09-06 DIAGNOSIS — N83201 Unspecified ovarian cyst, right side: Secondary | ICD-10-CM | POA: Diagnosis present

## 2021-09-06 DIAGNOSIS — K661 Hemoperitoneum: Secondary | ICD-10-CM | POA: Diagnosis present

## 2021-09-06 DIAGNOSIS — R1084 Generalized abdominal pain: Secondary | ICD-10-CM | POA: Diagnosis present

## 2021-09-06 DIAGNOSIS — R1031 Right lower quadrant pain: Secondary | ICD-10-CM | POA: Diagnosis not present

## 2021-09-06 DIAGNOSIS — N809 Endometriosis, unspecified: Secondary | ICD-10-CM

## 2021-09-06 DIAGNOSIS — N836 Hematosalpinx: Secondary | ICD-10-CM | POA: Diagnosis present

## 2021-09-06 DIAGNOSIS — R102 Pelvic and perineal pain: Secondary | ICD-10-CM | POA: Diagnosis present

## 2021-09-06 HISTORY — PX: LAPAROSCOPIC UNILATERAL SALPINGECTOMY: SHX5934

## 2021-09-06 HISTORY — PX: LAPAROSCOPY: SHX197

## 2021-09-06 SURGERY — LAPAROSCOPY, DIAGNOSTIC
Anesthesia: General | Site: Abdomen | Laterality: Right

## 2021-09-06 MED ORDER — ACETAMINOPHEN 10 MG/ML IV SOLN
INTRAVENOUS | Status: AC
  Filled 2021-09-06: qty 100

## 2021-09-06 MED ORDER — ACETAMINOPHEN 10 MG/ML IV SOLN
INTRAVENOUS | Status: DC | PRN
  Administered 2021-09-06: 1000 mg via INTRAVENOUS

## 2021-09-06 MED ORDER — ROCURONIUM BROMIDE 10 MG/ML (PF) SYRINGE
PREFILLED_SYRINGE | INTRAVENOUS | Status: AC
  Filled 2021-09-06: qty 10

## 2021-09-06 MED ORDER — BUPIVACAINE-MELOXICAM ER 200-6 MG/7ML IJ SOLN
INTRAMUSCULAR | Status: AC
  Filled 2021-09-06: qty 1

## 2021-09-06 MED ORDER — ROCURONIUM BROMIDE 100 MG/10ML IV SOLN
INTRAVENOUS | Status: DC | PRN
  Administered 2021-09-06 (×2): 10 mg via INTRAVENOUS
  Administered 2021-09-06: 40 mg via INTRAVENOUS
  Administered 2021-09-06: 10 mg via INTRAVENOUS

## 2021-09-06 MED ORDER — APREPITANT 40 MG PO CAPS
ORAL_CAPSULE | ORAL | Status: AC
  Filled 2021-09-06: qty 1

## 2021-09-06 MED ORDER — LACTATED RINGERS IV SOLN: INTRAVENOUS | Status: DC

## 2021-09-06 MED ORDER — VASOPRESSIN 20 UNIT/ML IV SOLN
INTRAVENOUS | Status: AC
  Filled 2021-09-06: qty 1

## 2021-09-06 MED ORDER — POVIDONE-IODINE 10 % EX SWAB: 2.0000 "application " | Freq: Once | CUTANEOUS | Status: DC

## 2021-09-06 MED ORDER — SUCCINYLCHOLINE CHLORIDE 200 MG/10ML IV SOSY
PREFILLED_SYRINGE | INTRAVENOUS | Status: DC | PRN
  Administered 2021-09-06: 140 mg via INTRAVENOUS

## 2021-09-06 MED ORDER — FENTANYL CITRATE (PF) 100 MCG/2ML IJ SOLN: 25.0000 ug | INTRAMUSCULAR | Status: DC | PRN

## 2021-09-06 MED ORDER — KETOROLAC TROMETHAMINE 30 MG/ML IJ SOLN: 30.0000 mg | Freq: Once | INTRAMUSCULAR | Status: DC

## 2021-09-06 MED ORDER — 0.9 % SODIUM CHLORIDE (POUR BTL) OPTIME
TOPICAL | Status: DC | PRN
  Administered 2021-09-06: 500 mL

## 2021-09-06 MED ORDER — FENTANYL CITRATE (PF) 100 MCG/2ML IJ SOLN
INTRAMUSCULAR | Status: AC
  Filled 2021-09-06: qty 2

## 2021-09-06 MED ORDER — ONDANSETRON HCL 4 MG/2ML IJ SOLN
INTRAMUSCULAR | Status: DC | PRN
Start: 2021-09-06 — End: 2021-09-06
  Administered 2021-09-06: 4 mg via INTRAVENOUS

## 2021-09-06 MED ORDER — SUGAMMADEX SODIUM 500 MG/5ML IV SOLN
INTRAVENOUS | Status: AC
  Filled 2021-09-06: qty 5

## 2021-09-06 MED ORDER — SODIUM CHLORIDE 0.9 % IR SOLN
Status: DC | PRN
  Administered 2021-09-06: 1000 mL

## 2021-09-06 MED ORDER — MIDAZOLAM HCL 2 MG/2ML IJ SOLN
INTRAMUSCULAR | Status: DC | PRN
Start: 2021-09-06 — End: 2021-09-06
  Administered 2021-09-06: 1 mg via INTRAVENOUS

## 2021-09-06 MED ORDER — DEXTROSE IN LACTATED RINGERS 5 % IV SOLN: INTRAVENOUS | Status: DC

## 2021-09-06 MED ORDER — NORETHINDRONE ACETATE 5 MG PO TABS
5.0000 mg | ORAL_TABLET | Freq: Four times a day (QID) | ORAL | Status: DC
Start: 2021-09-06 — End: 2021-09-06
  Filled 2021-09-06 (×2): qty 1

## 2021-09-06 MED ORDER — KETOROLAC TROMETHAMINE 30 MG/ML IJ SOLN
INTRAMUSCULAR | Status: AC
  Filled 2021-09-06: qty 1

## 2021-09-06 MED ORDER — BUPIVACAINE HCL 0.5 % IJ SOLN
INTRAMUSCULAR | Status: DC | PRN
  Administered 2021-09-06: 20 mL

## 2021-09-06 MED ORDER — FENTANYL CITRATE (PF) 100 MCG/2ML IJ SOLN
INTRAMUSCULAR | Status: DC | PRN
Start: 2021-09-06 — End: 2021-09-06
  Administered 2021-09-06: 50 ug via INTRAVENOUS

## 2021-09-06 MED ORDER — PROPOFOL 10 MG/ML IV BOLUS
INTRAVENOUS | Status: AC
  Filled 2021-09-06: qty 40

## 2021-09-06 MED ORDER — BUPIVACAINE HCL (PF) 0.5 % IJ SOLN
INTRAMUSCULAR | Status: AC
  Filled 2021-09-06: qty 30

## 2021-09-06 MED ORDER — APREPITANT 40 MG PO CAPS
40.0000 mg | ORAL_CAPSULE | Freq: Once | ORAL | Status: AC
  Administered 2021-09-06: 40 mg via ORAL

## 2021-09-06 MED ORDER — EPHEDRINE SULFATE (PRESSORS) 50 MG/ML IJ SOLN
INTRAMUSCULAR | Status: DC | PRN
  Administered 2021-09-06: 5 mg via INTRAVENOUS

## 2021-09-06 MED ORDER — DEXAMETHASONE SODIUM PHOSPHATE 10 MG/ML IJ SOLN
INTRAMUSCULAR | Status: AC
  Filled 2021-09-06: qty 1

## 2021-09-06 MED ORDER — ONDANSETRON HCL 4 MG/2ML IJ SOLN: 4.0000 mg | Freq: Four times a day (QID) | INTRAMUSCULAR | Status: DC | PRN

## 2021-09-06 MED ORDER — DEXAMETHASONE SODIUM PHOSPHATE 10 MG/ML IJ SOLN
INTRAMUSCULAR | Status: DC | PRN
  Administered 2021-09-06: 10 mg via INTRAVENOUS

## 2021-09-06 MED ORDER — PROPOFOL 10 MG/ML IV BOLUS
INTRAVENOUS | Status: DC | PRN
  Administered 2021-09-06: 150 mg via INTRAVENOUS

## 2021-09-06 MED ORDER — SUGAMMADEX SODIUM 500 MG/5ML IV SOLN
INTRAVENOUS | Status: DC | PRN
Start: 2021-09-06 — End: 2021-09-06
  Administered 2021-09-06: 256.8 mg via INTRAVENOUS

## 2021-09-06 MED ORDER — KETOROLAC TROMETHAMINE 30 MG/ML IJ SOLN
INTRAMUSCULAR | Status: DC | PRN
Start: 2021-09-06 — End: 2021-09-06
  Administered 2021-09-06: 30 mg via INTRAVENOUS

## 2021-09-06 MED ORDER — OXYCODONE HCL 5 MG PO TABS
ORAL_TABLET | ORAL | Status: AC
  Filled 2021-09-06: qty 1

## 2021-09-06 MED ORDER — OXYCODONE HCL 5 MG PO TABS
5.0000 mg | ORAL_TABLET | Freq: Once | ORAL | Status: AC
  Administered 2021-09-06: 5 mg via ORAL

## 2021-09-06 MED ORDER — MIDAZOLAM HCL 2 MG/2ML IJ SOLN
INTRAMUSCULAR | Status: AC
  Filled 2021-09-06: qty 2

## 2021-09-06 MED ORDER — LIDOCAINE HCL (CARDIAC) PF 100 MG/5ML IV SOSY
PREFILLED_SYRINGE | INTRAVENOUS | Status: DC | PRN
  Administered 2021-09-06: 80 mg via INTRAVENOUS

## 2021-09-06 MED ORDER — ONDANSETRON 4 MG PO TBDP: 4.0000 mg | ORAL_TABLET | Freq: Four times a day (QID) | ORAL | Status: DC | PRN

## 2021-09-06 MED ORDER — ONDANSETRON HCL 4 MG/2ML IJ SOLN
INTRAMUSCULAR | Status: AC
  Filled 2021-09-06: qty 2

## 2021-09-06 MED ORDER — OXYCODONE-ACETAMINOPHEN 5-325 MG PO TABS
1.0000 | ORAL_TABLET | ORAL | Status: DC | PRN
  Administered 2021-09-06: 2 via ORAL
  Administered 2021-09-07 (×2): 1 via ORAL
  Filled 2021-09-06 (×2): qty 2
  Filled 2021-09-06: qty 1

## 2021-09-06 MED ORDER — SODIUM CHLORIDE (PF) 0.9 % IJ SOLN
INTRAMUSCULAR | Status: AC
  Filled 2021-09-06: qty 50

## 2021-09-06 MED ORDER — ONDANSETRON HCL 4 MG/2ML IJ SOLN: 4.0000 mg | Freq: Once | INTRAMUSCULAR | Status: DC | PRN

## 2021-09-06 SURGICAL SUPPLY — 55 items
ADHESIVE MASTISOL STRL (MISCELLANEOUS) ×4 IMPLANT
BACTOSHIELD CHG 4% 4OZ (MISCELLANEOUS) ×1
BAG RETRIEVAL 10 (BASKET) ×1
BAG URINE DRAIN 2000ML AR STRL (UROLOGICAL SUPPLIES) ×4 IMPLANT
BLADE SURG 10 STRL SS SAFETY (BLADE) ×3 IMPLANT
BLADE SURG SZ11 CARB STEEL (BLADE) ×4 IMPLANT
CATH FOLEY 2WAY  5CC 16FR (CATHETERS) ×1
CATH URTH 16FR FL 2W BLN LF (CATHETERS) ×3 IMPLANT
CHLORAPREP W/TINT 26 (MISCELLANEOUS) ×4 IMPLANT
DERMABOND ADVANCED (GAUZE/BANDAGES/DRESSINGS)
DERMABOND ADVANCED .7 DNX12 (GAUZE/BANDAGES/DRESSINGS) ×3 IMPLANT
ELECT REM PT RETURN 9FT ADLT (ELECTROSURGICAL) ×4
ELECTRODE REM PT RTRN 9FT ADLT (ELECTROSURGICAL) ×3 IMPLANT
GLOVE ORTHO TXT STRL SZ7.5 (GLOVE) ×8 IMPLANT
GOWN STRL REUS W/ TWL LRG LVL3 (GOWN DISPOSABLE) ×6 IMPLANT
GOWN STRL REUS W/ TWL XL LVL3 (GOWN DISPOSABLE) ×6 IMPLANT
GOWN STRL REUS W/TWL LRG LVL3 (GOWN DISPOSABLE) ×2
GOWN STRL REUS W/TWL XL LVL3 (GOWN DISPOSABLE) ×2
GRASPER SUT TROCAR 14GX15 (MISCELLANEOUS) ×1 IMPLANT
HANDLE YANKAUER SUCT BULB TIP (MISCELLANEOUS) ×3 IMPLANT
IRRIGATION STRYKERFLOW (MISCELLANEOUS) IMPLANT
IRRIGATOR STRYKERFLOW (MISCELLANEOUS) ×4
IV LACTATED RINGERS 1000ML (IV SOLUTION) ×3 IMPLANT
IV NS 1000ML (IV SOLUTION) ×1
IV NS 1000ML BAXH (IV SOLUTION) IMPLANT
KIT PINK PAD W/HEAD ARE REST (MISCELLANEOUS) ×4 IMPLANT
KIT PINK PAD W/HEAD ARM REST (MISCELLANEOUS) ×3 IMPLANT
KIT TURNOVER CYSTO (KITS) ×4 IMPLANT
LIGASURE LAP MARYLAND 5MM 37CM (ELECTROSURGICAL) ×1 IMPLANT
MANIFOLD NEPTUNE II (INSTRUMENTS) ×4 IMPLANT
NDL SPNL 22GX3.5 QUINCKE BK (NEEDLE) ×3 IMPLANT
NEEDLE SPNL 22GX3.5 QUINCKE BK (NEEDLE) IMPLANT
PACK GYN LAPAROSCOPIC (MISCELLANEOUS) ×4 IMPLANT
PAD OB MATERNITY 4.3X12.25 (PERSONAL CARE ITEMS) ×3 IMPLANT
PAD PREP 24X41 OB/GYN DISP (PERSONAL CARE ITEMS) ×4 IMPLANT
PENCIL SMOKE EVACUATOR (MISCELLANEOUS) ×3 IMPLANT
RETRACTOR PHONTONGUIDE ADAPT (ADAPTER) ×3 IMPLANT
SCRUB CHG 4% DYNA-HEX 4OZ (MISCELLANEOUS) ×3 IMPLANT
SLEEVE ENDOPATH XCEL 5M (ENDOMECHANICALS) ×8 IMPLANT
SOL PREP PVP 2OZ (MISCELLANEOUS) ×4
SOLUTION ELECTROLUBE (MISCELLANEOUS) ×4 IMPLANT
SOLUTION PREP PVP 2OZ (MISCELLANEOUS) IMPLANT
STRIP CLOSURE SKIN 1/2X4 (GAUZE/BANDAGES/DRESSINGS) ×4 IMPLANT
SUT VIC AB 0 CT1 27 (SUTURE)
SUT VIC AB 0 CT1 27XCR 8 STRN (SUTURE) ×6 IMPLANT
SUT VIC AB 0 CT1 36 (SUTURE) ×7 IMPLANT
SUT VIC AB 4-0 FS2 27 (SUTURE) ×4 IMPLANT
SYR 10ML LL (SYRINGE) ×4 IMPLANT
SYR CONTROL 10ML LL (SYRINGE) ×3 IMPLANT
SYS BAG RETRIEVAL 10MM (BASKET) ×3
SYSTEM BAG RETRIEVAL 10MM (BASKET) IMPLANT
TROCAR XCEL NON-BLD 11X100MML (ENDOMECHANICALS) ×1 IMPLANT
TROCAR XCEL NON-BLD 5MMX100MML (ENDOMECHANICALS) ×4 IMPLANT
TUBING EVAC SMOKE HEATED PNEUM (TUBING) ×3 IMPLANT
WATER STERILE IRR 500ML POUR (IV SOLUTION) ×4 IMPLANT

## 2021-09-06 NOTE — Progress Notes (Signed)
Patient ID: Ashlee Curtis, female   DOB: 05/12/1981, 41 y.o.   MRN: [id number] ? ? ? ?Subjective:   ? She states that her bleeding has pretty much resolved.  She says her midline abdominal pain has also resolved but her right lower quadrant pain remains very significant.  She says she is unable to stand up straight and she can barely walk to get to the bathroom because of the pain.  She says the Dilaudid is helping but as soon as it wears off the pain returns.  She does not think she can go home and live like this. ? ?Objective:   ? ?No data found. ?Total I/O ?In: -  ?Out: 100 [Urine:100] ? ?Labs: ?Results for orders placed or performed during the hospital encounter of 09/05/21 (from the past 24 hour(s))  ?Lipase, blood     Status: None  ? Collection Time: 09/05/21 12:54 PM  ?Result Value Ref Range  ? Lipase 34 11 - 51 U/L  ?Comprehensive metabolic panel     Status: Abnormal  ? Collection Time: 09/05/21 12:54 PM  ?Result Value Ref Range  ? Sodium 139 135 - 145 mmol/L  ? Potassium 3.4 (L) 3.5 - 5.1 mmol/L  ? Chloride 104 98 - 111 mmol/L  ? CO2 26 22 - 32 mmol/L  ? Glucose, Bld 166 (H) 70 - 99 mg/dL  ? BUN 16 6 - 20 mg/dL  ? Creatinine, Ser 0.99 0.44 - 1.00 mg/dL  ? Calcium 8.9 8.9 - 10.3 mg/dL  ? Total Protein 6.9 6.5 - 8.1 g/dL  ? Albumin 3.8 3.5 - 5.0 g/dL  ? AST 15 15 - 41 U/L  ? ALT 19 0 - 44 U/L  ? Alkaline Phosphatase 54 38 - 126 U/L  ? Total Bilirubin 0.4 0.3 - 1.2 mg/dL  ? GFR, Estimated >60 >60 mL/min  ? Anion gap 9 5 - 15  ?CBC     Status: None  ? Collection Time: 09/05/21 12:54 PM  ?Result Value Ref Range  ? WBC 8.1 4.0 - 10.5 K/uL  ? RBC 4.34 3.87 - 5.11 MIL/uL  ? Hemoglobin 12.4 12.0 - 15.0 g/dL  ? HCT 36.9 36.0 - 46.0 %  ? MCV 85.0 80.0 - 100.0 fL  ? MCH 28.6 26.0 - 34.0 pg  ? MCHC 33.6 30.0 - 36.0 g/dL  ? RDW 12.9 11.5 - 15.5 %  ? Platelets 236 150 - 400 K/uL  ? nRBC 0.0 0.0 - 0.2 %  ?Type and screen [place]     Status: None  ? Collection Time: 09/05/21 12:54 PM  ?Result Value  Ref Range  ? ABO/RH(D) A NEG   ? Antibody Screen NEG   ? Sample Expiration    ?  09/08/2021,2359 ?Performed at [facility], [address] [place], [place] [address] ?  ?hCG, quantitative, pregnancy     Status: None  ? Collection Time: 09/05/21  1:06 PM  ?Result Value Ref Range  ? hCG, Beta Chain, Quant, S <1 <5 mIU/mL  ?Urinalysis, Routine w reflex microscopic     Status: Abnormal  ? Collection Time: 09/05/21  3:33 PM  ?Result Value Ref Range  ? Color, Urine RED (A) YELLOW  ? APPearance TURBID (A) CLEAR  ? Specific Gravity, Urine 1.027 1.005 - 1.030  ? pH  5.0 - 8.0  ?  TEST NOT REPORTED DUE TO COLOR INTERFERENCE OF URINE PIGMENT  ? Glucose, UA (A) NEGATIVE mg/dL  ?  TEST NOT REPORTED  DUE TO COLOR INTERFERENCE OF URINE PIGMENT  ? Hgb urine dipstick (A) NEGATIVE  ?  TEST NOT REPORTED DUE TO COLOR INTERFERENCE OF URINE PIGMENT  ? Bilirubin Urine (A) NEGATIVE  ?  TEST NOT REPORTED DUE TO COLOR INTERFERENCE OF URINE PIGMENT  ? Ketones, ur (A) NEGATIVE mg/dL  ?  TEST NOT REPORTED DUE TO COLOR INTERFERENCE OF URINE PIGMENT  ? Protein, ur (A) NEGATIVE mg/dL  ?  TEST NOT REPORTED DUE TO COLOR INTERFERENCE OF URINE PIGMENT  ? Nitrite (A) NEGATIVE  ?  TEST NOT REPORTED DUE TO COLOR INTERFERENCE OF URINE PIGMENT  ? Leukocytes,Ua (A) NEGATIVE  ?  TEST NOT REPORTED DUE TO COLOR INTERFERENCE OF URINE PIGMENT  ? RBC / HPF >50 (H) 0 - 5 RBC/hpf  ? WBC, UA >50 (H) 0 - 5 WBC/hpf  ? Bacteria, UA NONE SEEN NONE SEEN  ? Squamous Epithelial / LPF NONE SEEN 0 - 5  ? Mucus PRESENT   ?POC urine preg, ED     Status: None  ? Collection Time: 09/05/21  3:35 PM  ?Result Value Ref Range  ? Preg Test, Ur NEGATIVE NEGATIVE  ? ? ?Medications   ? ?Current Discharge Medication List  ?  ? ?CONTINUE these medications which have NOT CHANGED  ? Details  ?magic mouthwash w/lidocaine SOLN Take 5 mLs by mouth 4 (four) times daily. ?Qty: 100 mL, Refills: 0  ?  ?methylPREDNISolone (MEDROL DOSEPAK) 4 MG TBPK tablet Take Tapered dose as  directed ?Qty: 21 tablet, Refills: 0  ?  ?  ? ? ?  ?Assessment:   ? Patient's pain has remained unresolved.  Slightly enlarged right ovary.  Possible ovarian torsion. ? ?Plan:   ? We discussed multiple options and the patient has chosen surgery.  She says she cannot live with the current pain.  I discussed with her that this may not be ovarian torsion as we cannot use ultrasound to diagnose it in this case.  She understands that oophorectomy is likely.  She has completed childbearing and has a tubal ligation. ?We discussed the risks of surgery which include bleeding infection anesthesia.  We have discussed the possible risk of bowel bladder or other internal organ.  We discussed the fact that we may not find the source of her pain or it may be something different than we currently suspect.  All of her questions were answered. ? ?Plan exploratory laparoscopy with probable right oophorectomy. ? ?Finis Bud, M.D. ?09/06/2021 ?10:15 AM  ? ?

## 2021-09-06 NOTE — Anesthesia Preprocedure Evaluation (Signed)
Anesthesia Evaluation  ?Patient identified by MRN, date of birth, ID band ?Patient awake ? ? ? ?Reviewed: ?Allergy & Precautions, H&P , NPO status , Patient's Chart, lab work & pertinent test results, reviewed documented beta blocker date and time  ? ?Airway ?Mallampati: II ? ?TM Distance: >3 FB ?Neck ROM: full ? ? ? Dental ? ?(+) Teeth Intact ?  ?Pulmonary ?neg pulmonary ROS,  ?  ?Pulmonary exam normal ? ? ? ? ? ? ? Cardiovascular ?Exercise Tolerance: Good ?negative cardio ROS ?Normal cardiovascular exam ?Rhythm:regular Rate:Normal ? ? ?  ?Neuro/Psych ?negative neurological ROS ? negative psych ROS  ? GI/Hepatic ?negative GI ROS, Neg liver ROS,   ?Endo/Other  ?negative endocrine ROS ? Renal/GU ?negative Renal ROS  ?negative genitourinary ?  ?Musculoskeletal ? ? Abdominal ?  ?Peds ? Hematology ?negative hematology ROS ?(+)   ?Anesthesia Other Findings ?Past Medical History: ?No date: Ovarian cyst ?Past Surgical History: ?No date: CESAREAN SECTION ?    Comment:  4 cesareans ?BMI   ? Body Mass Index: 50.13 kg/m?  ?  ? Reproductive/Obstetrics ?negative OB ROS ? ?  ? ? ? ? ? ? ? ? ? ? ? ? ? ?  ?  ? ? ? ? ? ? ? ? ?Anesthesia Physical ?Anesthesia Plan ? ?ASA: 2 and emergent ? ?Anesthesia Plan: General ETT  ? ?Post-op Pain Management:   ? ?Induction:  ? ?PONV Risk Score and Plan: Aprepitant ? ?Airway Management Planned:  ? ?Additional Equipment:  ? ?Intra-op Plan:  ? ?Post-operative Plan:  ? ?Informed Consent: I have reviewed the patients History and Physical, chart, labs and discussed the procedure including the risks, benefits and alternatives for the proposed anesthesia with the patient or authorized representative who has indicated his/her understanding and acceptance.  ? ? ? ?Dental Advisory Given ? ?Plan Discussed with: CRNA ? ?Anesthesia Plan Comments:   ? ? ? ? ? ? ?Anesthesia Quick Evaluation ? ?

## 2021-09-06 NOTE — Anesthesia Procedure Notes (Signed)
Procedure Name: Intubation ?Date/Time: 09/06/2021 1:16 PM ?Performed by: Timmothy Euler, RN ?Pre-anesthesia Checklist: Patient identified, Patient being monitored, Timeout performed, Emergency Drugs available and Suction available ?Patient Re-evaluated:Patient Re-evaluated prior to induction ?Oxygen Delivery Method: Circle system utilized ?Preoxygenation: Pre-oxygenation with 100% oxygen ?Induction Type: IV induction ?Ventilation: Two handed mask ventilation required ?Laryngoscope Size: McGraph and 4 ?Grade View: Grade I ?Tube type: Oral ?Tube size: 7.0 mm ?Number of attempts: 1 ?Airway Equipment and Method: Stylet ?Placement Confirmation: ETT inserted through vocal cords under direct vision, positive ETCO2 and breath sounds checked- equal and bilateral ?Secured at: 22 cm ?Tube secured with: Tape ?Dental Injury: Teeth and Oropharynx as per pre-operative assessment  ? ? ? ? ?

## 2021-09-06 NOTE — Transfer of Care (Signed)
Immediate Anesthesia Transfer of Care Note ? ?Patient: Ashlee Curtis ? ?Procedure(s) Performed: LAPAROSCOPY DIAGNOSTIC (Abdomen) ?LAPAROSCOPIC OOPHORECTOMY (Right: Abdomen) ?LAPAROSCOPIC UNILATERAL SALPINGECTOMY (Left: Abdomen) ? ?Patient Location: PACU ? ?Anesthesia Type:General ? ?Level of Consciousness: drowsy ? ?Airway & Oxygen Therapy: Patient Spontanous Breathing and Patient connected to face mask oxygen ? ?Post-op Assessment: Report given to RN and Post -op Vital signs reviewed and stable ? ?Post vital signs: Reviewed and stable ? ?Last Vitals:  ?Vitals Value Taken Time  ?BP    ?Temp    ?Pulse    ?Resp    ?SpO2    ? ? ?Last Pain:  ?Vitals:  ? 09/06/21 1129  ?TempSrc: Oral  ?PainSc: 5   ?   ? ?Patients Stated Pain Goal: 0 (09/06/21 0919) ? ?Complications: No notable events documented. ?

## 2021-09-06 NOTE — Interval H&P Note (Signed)
History and Physical Interval Note: ? ?09/06/2021 ?10:18 AM ? ?Ashlee Curtis  has presented today for surgery, with the diagnosis of Ovarian torsion.  The various methods of treatment have been discussed with the patient and family. After consideration of risks, benefits and other options for treatment, the patient has consented to  Procedure(s): ?LAPAROSCOPY DIAGNOSTIC (N/A) ?LAPAROSCOPIC OOPHORECTOMY (Right) as a surgical intervention.  The patient's history has been reviewed, patient examined, no change in status, stable for surgery.  I have reviewed the patient's chart and labs.  Questions were answered to the patient's satisfaction.   ? ?Patient states that her pain is unresolved and is controlled only with Dilaudid.  She remains unable to stand up straight or move appropriately without significant right lower quadrant pain. ? ? ?Jeannie Fend ? ? ?

## 2021-09-06 NOTE — Op Note (Signed)
@  LOGO@ ? ?  OPERATIVE NOTE ?09/06/2021 ?3:49 PM ? ?PRE-OPERATIVE DIAGNOSIS:  ?1) Possible Ovarian torsion ? ?POST-OPERATIVE DIAGNOSIS:  ?1) Abnormal right ovary - possible torsion - possible hemorrhagic cyst ?2) Dilated left fallopian tube-possible hematosalpinx. (Previous tubal ligation) ? ?OPERATION:  LAPAROSCOPY DIAGNOSTIC: 49320 (CPT?) ?LAPAROSCOPIC OOPHORECTOMY: [id number] (CPT?) ?LAPAROSCOPIC UNILATERAL SALPINGECTOMY: 49449 (CPT?) ? ?SURGEON(S): Surgeon(s) and Role: ?   Harlin Heys, MD - Primary  ? ?ANESTHESIA: General ? ?ESTIMATED BLOOD LOSS: 10 mL ? ?OPERATIVE FINDINGS: Significant blood in the pelvis likely secondary to heavy menses although the left fallopian tube appears dilated and blunted at the area of tubal ligation.  Because of adhesions could not tell if the right fallopian tube was open.  Also possible that the patient had a right hemorrhagic ovarian cyst that had ruptured.  No new blood noted in the pelvis-dark old or blood.  Possibly history of endometriosis although could not tell. ? Right ovary scarred to the anterior sidewall.  Uterus stuck to the anterior wall likely from multiple cesarean deliveries. ? Normal-appearing left ovary. ? ?SPECIMEN:  ?ID Type Source Tests Collected by Time Destination  ?1 : RIGHT OVARY AND LEFT FALLOPIAN TUBE Tissue PATH Other SURGICAL PATHOLOGY Harlin Heys, MD 09/06/2021 1424   ? ? ?COMPLICATIONS: None ? ?DISPOSITION: Stable to recovery room ? ?DESCRIPTION OF PROCEDURE: ?     The patient was prepped and draped in the dorsolithotomy position and placed under general anesthesia. The bladder was emptied. The cervix was grasped with a multi-toothed tenaculum and a uterine manipulator was placed within the cervical os respecting the position and curvature of the uterus. ?After changing gloves we proceeded abdominally. A small infraumbilical incision was made and a 5 mm trocar port was placed within the abdominopelvic cavity. The opening pressure was less than  7 mmHg.  Approximately 3 and 1/2 L of carbon dioxide gas was instilled within the abdominal pelvic cavity. The laparoscope was placed and the pelvis and abdomen were carefully inspected. ?A dilated large fluctuant right ovary was encountered.  It had adhesions to the right sidewall.  These adhesions were systematically lysed using blunt and sharp dissection.  The infundibulopelvic was carefully identified triply coagulated and divided.  The utero-ovarian ligament was identified coagulated and divided. ?The left fallopian tube appeared clogged at the area of tubal ligation and enlarged.  It was coagulated along its mesenteric side and removed.  The pelvis was irrigated and all old blood was suctioned out.  Hemostasis of all pedicles was visualized. ?The ports were removed under direct visualization.  Hemostasis was noted.  The laparoscope was removed the trocar sleeve was removed and the 11 mm incision was closed with a deep suture through the fascia of 0 Vicryl using the PMI followed by subcuticular closure of the skin for all port sites. A long-acting anesthetic was injected.  Dermabond was applied. The uterine manipulator was removed. Hemostasis of the cervix was noted. The patient went to the recovery room in stable condition. ? ?Finis Bud, M.D. ?09/06/2021 ?3:49 PM ? ?

## 2021-09-07 ENCOUNTER — Encounter: Payer: Self-pay | Admitting: Obstetrics and Gynecology

## 2021-09-07 MED ORDER — IBUPROFEN 800 MG PO TABS: 800.0000 mg | ORAL_TABLET | Freq: Three times a day (TID) | ORAL | 0 refills | Status: DC | PRN

## 2021-09-07 MED ORDER — HYDROCODONE-ACETAMINOPHEN 5-325 MG PO TABS: 1.0000 | ORAL_TABLET | Freq: Four times a day (QID) | ORAL | 0 refills | Status: DC | PRN

## 2021-09-07 NOTE — Progress Notes (Signed)
Pt discharged home.  Discharge instructions, prescriptions and follow up appointment given to and reviewed with pt.  Pt verbalized understanding.  Escorted by staff. 

## 2021-09-07 NOTE — Discharge Summary (Signed)
? ? ?  Discharge Summary ? ?Admit date: 09/05/2021 ? ?Discharge Date and Time:09/07/2021  8:50 AM ? ?Discharge to:  Home ? ?Admission Diagnosis: Present on Admission: ? Pelvic pain ?                   ? ?Discharge  Diagnoses: Principal Problem: ?  Pelvic pain ?  Hemoperitoneum with enlarged right ovary possible ruptured hemorrhagic cyst. ? ?OR Procedures:   ?Procedure(s): ?LAPAROSCOPY DIAGNOSTIC ?LAPAROSCOPIC OOPHORECTOMY ?LAPAROSCOPIC UNILATERAL SALPINGECTOMY ?Date ?------------------- ?  ?                           Discharge Day Progress Note: ? ? Subjective: ?  The patient does not have complaints.  She is ambulating well. She is taking PO well. Her pain is well controlled with her current medications. She is urinating without difficulty and is passing flatus. ? ? Objective: ? BP 130/78 (BP Location: Left Arm)   Pulse 71   Temp 98.6 ?F (37 ?C) (Oral)   Resp 18   Ht '5\' 3"'$  (1.6 m)   Wt 128.4 kg   LMP 09/04/2021 (Exact Date)   SpO2 94%   BMI 50.13 kg/m?   ?  Abdomen:  ?                       clean, dry, healing ?  ? Assessment: ?  Doing well.  Normal progress as expected. ?  Pain controlled ? Plan: ?       Discharge home. ?                      Medications as directed. ? ?Hospital Course:  No notes on file ? ? ?Condition at Discharge:  good ?Discharge Medications:  ?Allergies as of 09/07/2021   ? ?   Reactions  ? Sulfa Antibiotics Hives  ? ?  ? ?  ?Medication List  ?  ? ?STOP taking these medications   ? ?magic mouthwash w/lidocaine Soln ?  ?methylPREDNISolone 4 MG Tbpk tablet ?Commonly known as: MEDROL DOSEPAK ?  ? ?  ? ?TAKE these medications   ? ?HYDROcodone-acetaminophen 5-325 MG tablet ?Commonly known as: NORCO/VICODIN ?Take 1-2 tablets by mouth every 6 (six) hours as needed for moderate pain. ?  ?ibuprofen 800 MG tablet ?Commonly known as: ADVIL ?Take 1 tablet (800 mg total) by mouth every 8 (eight) hours as needed (mild pain). ?  ? ?  ? ?  ?  ? ? ?  ?Discharge Care Instructions  ?(From admission, onward)   ?  ? ? ?  ? ?  Start     Ordered  ? 09/07/21 0000  No dressing needed       ?Comments: Keep wound area clean and dry as directed  ? 09/07/21 0820  ? ?  ?  ? ?  ? ? ? ?Follow Up:   ? Follow-up Information   ? ? Harlin Heys, MD Follow up in 2 week(s).   ?Specialties: Obstetrics and Gynecology, Radiology ?Contact information: ?[address] ?[address] ?[place] [address] ?[phone] ? ? ?  ?  ? ?  ?  ? ?  ? ? ?Finis Bud, M.D. ?09/07/2021 ?8:50 AM ? ? ? ?

## 2021-09-08 LAB — SURGICAL PATHOLOGY

## 2021-09-11 NOTE — Anesthesia Postprocedure Evaluation (Signed)
Anesthesia Post Note ? ?Patient: Ashlee Curtis ? ?Procedure(s) Performed: LAPAROSCOPY DIAGNOSTIC (Abdomen) ?LAPAROSCOPIC OOPHORECTOMY (Right: Abdomen) ?LAPAROSCOPIC UNILATERAL SALPINGECTOMY (Left: Abdomen) ? ?Patient location during evaluation: PACU ?Anesthesia Type: General ?Level of consciousness: awake and alert ?Pain management: pain level controlled ?Vital Signs Assessment: post-procedure vital signs reviewed and stable ?Respiratory status: spontaneous breathing, nonlabored ventilation, respiratory function stable and patient connected to nasal cannula oxygen ?Cardiovascular status: blood pressure returned to baseline and stable ?Postop Assessment: no apparent nausea or vomiting ?Anesthetic complications: no ? ? ?No notable events documented. ? ? ?Last Vitals:  ?Vitals:  ? 09/07/21 1118 09/07/21 1536  ?BP: 131/82 122/67  ?Pulse: 80 75  ?Resp: 18 18  ?Temp: 37.3 ?C 36.7 ?C  ?SpO2:    ?  ?Last Pain:  ?Vitals:  ? 09/07/21 1645  ?TempSrc:   ?PainSc: 2   ? ? ?  ?  ?  ?  ?  ?  ? ?[name] ? ? ? ? ?

## 2021-09-19 ENCOUNTER — Encounter: Payer: Self-pay | Admitting: Obstetrics and Gynecology

## 2021-09-21 ENCOUNTER — Ambulatory Visit (INDEPENDENT_AMBULATORY_CARE_PROVIDER_SITE_OTHER): Payer: Managed Care, Other (non HMO) | Admitting: Obstetrics and Gynecology

## 2021-09-21 ENCOUNTER — Encounter: Payer: Self-pay | Admitting: Obstetrics and Gynecology

## 2021-09-21 DIAGNOSIS — Z9889 Other specified postprocedural states: Secondary | ICD-10-CM

## 2021-09-21 NOTE — Progress Notes (Signed)
Patient presents today for post-surgical follow-up. She states no bleeding at this time but is having left sided pain that radiates. Patient states concern regarding a "knot" behind left incision. She states a history of painful and heavy periods, interested in discussing birth control options. Patient states no other questions or concerns at this time.  ?

## 2021-09-21 NOTE — Progress Notes (Signed)
HPI: ?     [name] is a 41 y.o. [id number] who LMP was Patient's last menstrual period was 09/04/2021 (exact date). ? ?Subjective:  ? ?She presents today approximately 2 weeks from right nephrectomy for suspected ovarian torsion and left salpingectomy for dilation and suspected endometriosis. ?Subsequent pathology revealed right endometrioma. ?Patient states that her pain on the right is completely resolved.  She says that she has not been pain-free on the side for many years.  She reports her incisions are doing well.  Left lower quadrant incision occasionally feels more tender but otherwise no issues. ? ?  Hx: ?The following portions of the patient's history were reviewed and updated as appropriate: ?            She  has a past medical history of Ovarian cyst. ?She does not have any pertinent problems on file. ?She  has a past surgical history that includes Cesarean section; laparoscopy (N/A, 09/06/2021); and Laparoscopic unilateral salpingectomy (Left, 09/06/2021). ?Her family history is not on file. ?She  reports that she has never smoked. She has never used smokeless tobacco. She reports that she does not currently use alcohol. She reports that she does not use drugs. ?She has a current medication list which includes the following prescription(s): acetaminophen, elderberry, ibuprofen, multivitamin, and vitamin b-12. ?She is allergic to sulfa antibiotics. ?      ?Review of Systems:  ?Review of Systems ? ?Constitutional: Denied constitutional symptoms, night sweats, recent illness, fatigue, fever, insomnia and weight loss.  ?Eyes: Denied eye symptoms, eye pain, photophobia, vision change and visual disturbance.  ?Ears/Nose/Throat/Neck: Denied ear, nose, throat or neck symptoms, hearing loss, nasal discharge, sinus congestion and sore throat.  ?Cardiovascular: Denied cardiovascular symptoms, arrhythmia, chest pain/pressure, edema, exercise intolerance, orthopnea and palpitations.  ?Respiratory: Denied  pulmonary symptoms, asthma, pleuritic pain, productive sputum, cough, dyspnea and wheezing.  ?Gastrointestinal: Denied, gastro-esophageal reflux, melena, nausea and vomiting.  ?Genitourinary: Denied genitourinary symptoms including symptomatic vaginal discharge, pelvic relaxation issues, and urinary complaints.  ?Musculoskeletal: Denied musculoskeletal symptoms, stiffness, swelling, muscle weakness and myalgia.  ?Dermatologic: Denied dermatology symptoms, rash and scar.  ?Neurologic: Denied neurology symptoms, dizziness, headache, neck pain and syncope.  ?Psychiatric: Denied psychiatric symptoms, anxiety and depression.  ?Endocrine: Denied endocrine symptoms including hot flashes and night sweats.  ? ?Meds: ?  ?Current Outpatient Medications on File Prior to Visit  ?Medication Sig Dispense Refill  ? acetaminophen (TYLENOL) 325 MG tablet Take 650 mg by mouth every 6 (six) hours as needed.    ? ELDERBERRY PO Take by mouth.    ? ibuprofen (ADVIL) 800 MG tablet Take 1 tablet (800 mg total) by mouth every 8 (eight) hours as needed (mild pain). 30 tablet 0  ? Multiple Vitamin (MULTIVITAMIN) capsule Take 1 capsule by mouth daily.    ? vitamin B-12 (CYANOCOBALAMIN) 500 MCG tablet Take 500 mcg by mouth daily.    ? ?No current facility-administered medications on file prior to visit.  ? ? ? ? ?Objective:  ?  ? ?There were no vitals filed for this visit. ?There were no vitals filed for this visit. ?  ?          ?Abdomen: Soft.  Non-tender.  No masses.  No HSM.  ?Incision/s: Intact.  Healing well.  No erythema.  No drainage.  ? ? ?        ? ?Assessment:  ?  ?[id number] ?Patient Active Problem List  ? Diagnosis Date Noted  ? Pelvic pain 09/05/2021  ? ?  ?  1. Postoperative state   ? ? Excellent recovery ? Endometriosis/endometrioma by pathology ? ? ?Plan:  ?  ?       ? 1.  Patient may resume normal activities. ?Orders ?No orders of the defined types were placed in this encounter. ? ? No orders of the defined types were placed in this  encounter. ?  ?  F/U ? Return for Annual Physical. ? ?Finis Bud, M.D. ?09/21/2021 ?3:40 PM ? ? ? ? ?

## 2022-06-29 ENCOUNTER — Encounter: Payer: Self-pay | Admitting: *Deleted

## 2022-06-30 ENCOUNTER — Ambulatory Visit: Payer: Managed Care, Other (non HMO) | Admitting: Certified Registered Nurse Anesthetist

## 2022-06-30 ENCOUNTER — Encounter
Admission: RE | Disposition: A | Discharge status: Home / Self Care | Payer: Self-pay | Source: Home / Self Care | Attending: Gastroenterology

## 2022-06-30 ENCOUNTER — Ambulatory Visit
Admission: RE | Admit: 2022-06-30 | Discharge: 2022-06-30 | Disposition: A | Discharge status: Home / Self Care | Payer: Managed Care, Other (non HMO) | Attending: Gastroenterology | Admitting: Gastroenterology

## 2022-06-30 DIAGNOSIS — Z800 Family history of malignant neoplasm of digestive organs: Secondary | ICD-10-CM | POA: Diagnosis not present

## 2022-06-30 DIAGNOSIS — K648 Other hemorrhoids: Secondary | ICD-10-CM | POA: Diagnosis not present

## 2022-06-30 DIAGNOSIS — D120 Benign neoplasm of cecum: Secondary | ICD-10-CM | POA: Diagnosis not present

## 2022-06-30 DIAGNOSIS — Z1211 Encounter for screening for malignant neoplasm of colon: Secondary | ICD-10-CM | POA: Diagnosis present

## 2022-06-30 DIAGNOSIS — Z6839 Body mass index (BMI) 39.0-39.9, adult: Secondary | ICD-10-CM | POA: Insufficient documentation

## 2022-06-30 DIAGNOSIS — K640 First degree hemorrhoids: Secondary | ICD-10-CM | POA: Diagnosis not present

## 2022-06-30 HISTORY — PX: COLONOSCOPY WITH PROPOFOL: SHX5780

## 2022-06-30 LAB — POCT PREGNANCY, URINE: Preg Test, Ur: NEGATIVE

## 2022-06-30 SURGERY — COLONOSCOPY WITH PROPOFOL
Anesthesia: General

## 2022-06-30 MED ORDER — MIDAZOLAM HCL 2 MG/2ML IJ SOLN
INTRAMUSCULAR | Status: DC | PRN
  Administered 2022-06-30 (×2): 1 mg via INTRAVENOUS

## 2022-06-30 MED ORDER — LIDOCAINE HCL (CARDIAC) PF 100 MG/5ML IV SOSY
PREFILLED_SYRINGE | INTRAVENOUS | Status: DC | PRN
  Administered 2022-06-30: 50 mg via INTRAVENOUS

## 2022-06-30 MED ORDER — PROPOFOL 500 MG/50ML IV EMUL
INTRAVENOUS | Status: DC | PRN
  Administered 2022-06-30: 150 ug/kg/min via INTRAVENOUS

## 2022-06-30 MED ORDER — SODIUM CHLORIDE 0.9 % IV SOLN
INTRAVENOUS | Status: DC
  Administered 2022-06-30: 20 mL/h via INTRAVENOUS

## 2022-06-30 MED ORDER — MIDAZOLAM HCL 2 MG/2ML IJ SOLN
INTRAMUSCULAR | Status: AC
  Filled 2022-06-30: qty 2

## 2022-06-30 MED ORDER — PROPOFOL 10 MG/ML IV BOLUS
INTRAVENOUS | Status: DC | PRN
  Administered 2022-06-30: 60 mg via INTRAVENOUS

## 2022-06-30 NOTE — H&P (Signed)
Outpatient short stay form Pre-procedure 06/30/2022  Lesly Rubenstein, MD  Primary Physician: Maguayo  Reason for visit:  Screening colonoscopy  History of present illness:    42 y/o lady with history of obesity here for screening colonoscopy. Mother may have had "cancerous cells" in a polyp vs precancerous, patient is unsure but grandparents on both sides had colon cancer. No blood thinners. History of oopherectomy and c-sections.    Current Facility-Administered Medications:    0.9 %  sodium chloride infusion, , Intravenous, Continuous, Maddox Bratcher, Hilton Cork, MD, Last Rate: 20 mL/hr at 06/30/22 1050, Continued from Pre-op at 06/30/22 1050  Medications Prior to Admission  Medication Sig Dispense Refill Last Dose   acetaminophen (TYLENOL) 325 MG tablet Take 650 mg by mouth every 6 (six) hours as needed.   Past Week   ELDERBERRY PO Take by mouth.   Past Week   ibuprofen (ADVIL) 800 MG tablet Take 1 tablet (800 mg total) by mouth every 8 (eight) hours as needed (mild pain). 30 tablet 0 Past Week   Multiple Vitamin (MULTIVITAMIN) capsule Take 1 capsule by mouth daily.   Past Week   vitamin B-12 (CYANOCOBALAMIN) 500 MCG tablet Take 500 mcg by mouth daily.   Past Week     Allergies  Allergen Reactions   Sulfa Antibiotics Hives     Past Medical History:  Diagnosis Date   Ovarian cyst     Review of systems:  Otherwise negative.    Physical Exam  Gen: Alert, oriented. Appears stated age.  HEENT: PERRLA. Lungs: No respiratory distress CV: RRR Abd: soft, benign, no masses Ext: No edema    Planned procedures: Proceed with colonoscopy. The patient understands the nature of the planned procedure, indications, risks, alternatives and potential complications including but not limited to bleeding, infection, perforation, damage to internal organs and possible oversedation/side effects from anesthesia. The patient agrees and gives consent to proceed.  Please  refer to procedure notes for findings, recommendations and patient disposition/instructions.     Lesly Rubenstein, MD [facility]

## 2022-06-30 NOTE — Anesthesia Postprocedure Evaluation (Signed)
Anesthesia Post Note  Patient: Ashlee Curtis  Procedure(s) Performed: COLONOSCOPY WITH PROPOFOL  Patient location during evaluation: PACU Anesthesia Type: General Level of consciousness: awake and awake and alert Pain management: satisfactory to patient Vital Signs Assessment: post-procedure vital signs reviewed and stable Respiratory status: nonlabored ventilation Cardiovascular status: stable Anesthetic complications: no   No notable events documented.   Last Vitals:  Vitals:   06/30/22 1116 06/30/22 1117  BP: 119/63   Pulse:  65  Resp:  19  Temp: (!) 36.1 C   SpO2:  99%    Last Pain:  Vitals:   06/30/22 1136  TempSrc:   PainSc: 0-No pain                 VAN STAVEREN,Lavenia Stumpo

## 2022-06-30 NOTE — Interval H&P Note (Signed)
History and Physical Interval Note:  06/30/2022 10:53 AM  Ashlee Curtis  has presented today for surgery, with the diagnosis of family hx of colon cancer.  The various methods of treatment have been discussed with the patient and family. After consideration of risks, benefits and other options for treatment, the patient has consented to  Procedure(s): COLONOSCOPY WITH PROPOFOL (N/A) as a surgical intervention.  The patient's history has been reviewed, patient examined, no change in status, stable for surgery.  I have reviewed the patient's chart and labs.  Questions were answered to the patient's satisfaction.     Lesly Rubenstein  Ok to proceed with colonoscopy

## 2022-06-30 NOTE — Anesthesia Procedure Notes (Signed)
Date/Time: 06/30/2022 10:50 AM  Performed by: Johnna Acosta, CRNAPre-anesthesia Checklist: Patient identified, Emergency Drugs available, Suction available, Patient being monitored and Timeout performed Patient Re-evaluated:Patient Re-evaluated prior to induction Oxygen Delivery Method: Nasal cannula Preoxygenation: Pre-oxygenation with 100% oxygen Induction Type: IV induction

## 2022-06-30 NOTE — Transfer of Care (Signed)
Immediate Anesthesia Transfer of Care Note  Patient: Ashlee Curtis  Procedure(s) Performed: COLONOSCOPY WITH PROPOFOL  Patient Location: Endoscopy Unit  Anesthesia Type:General  Level of Consciousness: sedated  Airway & Oxygen Therapy: Patient Spontanous Breathing  Post-op Assessment: Report given to RN and Post -op Vital signs reviewed and stable  Post vital signs: Reviewed and stable  Last Vitals:  Vitals Value Taken Time  BP 119/63 06/30/22 1116  Temp 36.1 C 06/30/22 1116  Pulse 65 06/30/22 1117  Resp 19 06/30/22 1117  SpO2 97 % 06/30/22 1117  Vitals shown include unvalidated device data.  Last Pain:  Vitals:   06/30/22 1116  TempSrc: Temporal  PainSc: Asleep         Complications: No notable events documented.

## 2022-06-30 NOTE — Op Note (Signed)
[facility] Patient Name: Ashlee Curtis Procedure Date: 06/30/2022 10:45 AM MRN: [id number] Account #: [id number] Date of Birth: [birth year] Admit Type: Outpatient Age: 42 Room: [facility] ROOM 3 Gender: Female Note Status: Finalized Instrument Name: Park Meo [id number] Procedure:             Colonoscopy Indications:           Screening for colon cancer: Family history of                         colorectal cancer in multiple 2nd degree relatives Providers:             Andrey Farmer MD, MD Referring MD:          No Local Md, MD (Referring MD) Medicines:             Monitored Anesthesia Care Complications:         No immediate complications. Estimated blood loss:                         Minimal. Procedure:             Pre-Anesthesia Assessment:                        - Prior to the procedure, a History and Physical was                         performed, and patient medications and allergies were                         reviewed. The patient is competent. The risks and                         benefits of the procedure and the sedation options and                         risks were discussed with the patient. All questions                         were answered and informed consent was obtained.                         Patient identification and proposed procedure were                         verified by the physician, the nurse, the                         anesthesiologist, the anesthetist and the technician                         in the endoscopy suite. Mental Status Examination:                         alert and oriented. Airway Examination: normal                         oropharyngeal airway and neck mobility. Respiratory  Examination: clear to auscultation. CV Examination:                         normal. Prophylactic Antibiotics: The patient does not                         require prophylactic antibiotics. Prior                          Anticoagulants: The patient has taken no anticoagulant                         or antiplatelet agents. ASA Grade Assessment: II - A                         patient with mild systemic disease. After reviewing                         the risks and benefits, the patient was deemed in                         satisfactory condition to undergo the procedure. The                         anesthesia plan was to use monitored anesthesia care                         (MAC). Immediately prior to administration of                         medications, the patient was re-assessed for adequacy                         to receive sedatives. The heart rate, respiratory                         rate, oxygen saturations, blood pressure, adequacy of                         pulmonary ventilation, and response to care were                         monitored throughout the procedure. The physical                         status of the patient was re-assessed after the                         procedure.                        After obtaining informed consent, the colonoscope was                         passed under direct vision. Throughout the procedure,                         the patient's blood pressure, pulse, and oxygen  saturations were monitored continuously. The                         Colonoscope was introduced through the anus and                         advanced to the the terminal ileum. The colonoscopy                         was performed without difficulty. The patient                         tolerated the procedure well. The quality of the bowel                         preparation was good. The terminal ileum, ileocecal                         valve, appendiceal orifice, and rectum were                         photographed. Findings:      The perianal and digital rectal examinations were normal.      The terminal ileum appeared normal.      A 5 mm polyp was found in  the cecum. The polyp was sessile. The polyp       was removed with a cold snare. Resection and retrieval were complete.       Estimated blood loss was minimal.      Anal papilla(e) were hypertrophied.      Internal hemorrhoids were found during retroflexion. The hemorrhoids       were Grade I (internal hemorrhoids that do not prolapse).      The exam was otherwise without abnormality on direct and retroflexion       views. Impression:            - The examined portion of the ileum was normal.                        - One 5 mm polyp in the cecum, removed with a cold                         snare. Resected and retrieved.                        - Anal papilla(e) were hypertrophied.                        - Internal hemorrhoids.                        - The examination was otherwise normal on direct and                         retroflexion views. Recommendation:        - Discharge patient to home.                        - Resume previous diet.                        -  Continue present medications.                        - Await pathology results.                        - Repeat colonoscopy in 5 years for surveillance.                        - Return to referring physician as previously                         scheduled. Procedure Code(s):     --- Professional ---                        [phone], Colonoscopy, flexible; with removal of                         tumor(s), polyp(s), or other lesion(s) by snare                         technique Diagnosis Code(s):     --- Professional ---                        Z80.0, Family history of malignant neoplasm of                         digestive organs                        K64.0, First degree hemorrhoids                        D12.0, Benign neoplasm of cecum                        K62.89, Other specified diseases of anus and rectum CPT copyright 2022 [employer]. All rights reserved. The codes documented in this report are preliminary  and upon coder review may  be revised to meet current compliance requirements. Andrey Farmer MD, MD 06/30/2022 11:18:33 AM Number of Addenda: 0 Note Initiated On: 06/30/2022 10:45 AM Scope Withdrawal Time: 0 hours 7 minutes 43 seconds  Total Procedure Duration: 0 hours 14 minutes 14 seconds  Estimated Blood Loss:  Estimated blood loss was minimal.      [facility]

## 2022-06-30 NOTE — Anesthesia Preprocedure Evaluation (Signed)
Anesthesia Evaluation  Patient identified by MRN, date of birth, ID band Patient awake    Reviewed: Allergy & Precautions, NPO status , Patient's Chart, lab work & pertinent test results  Airway Mallampati: III  TM Distance: >3 FB Neck ROM: Full  Mouth opening: Limited Mouth Opening  Dental  (+) Poor Dentition, Dental Advisory Given   Pulmonary neg pulmonary ROS   Pulmonary exam normal breath sounds clear to auscultation       Cardiovascular Exercise Tolerance: Good negative cardio ROS Normal cardiovascular exam Rhythm:Regular Rate:Normal     Neuro/Psych negative neurological ROS  negative psych ROS   GI/Hepatic negative GI ROS, Neg liver ROS,,,  Endo/Other  negative endocrine ROS  Morbid obesity  Renal/GU negative Renal ROS  negative genitourinary   Musculoskeletal   Abdominal  (+) + obese  Peds negative pediatric ROS (+)  Hematology negative hematology ROS (+)   Anesthesia Other Findings Past Medical History: No date: Ovarian cyst  Past Surgical History: No date: CESAREAN SECTION     Comment:  4 cesareans 09/06/2021: LAPAROSCOPIC UNILATERAL SALPINGECTOMY; Left     Comment:  Procedure: LAPAROSCOPIC UNILATERAL SALPINGECTOMY;                Surgeon: Harlin Heys, MD;  Location: [facility] ORS;                Service: Gynecology;  Laterality: Left; 09/06/2021: LAPAROSCOPY; N/A     Comment:  Procedure: LAPAROSCOPY DIAGNOSTIC;  Surgeon: Harlin Heys, MD;  Location: [facility] ORS;  Service:               Gynecology;  Laterality: N/A;  BMI    Body Mass Index: 39.47 kg/m      Reproductive/Obstetrics negative OB ROS                             Anesthesia Physical Anesthesia Plan  ASA: 3  Anesthesia Plan: General   Post-op Pain Management:    Induction: Intravenous  PONV Risk Score and Plan: Propofol infusion and TIVA  Airway Management Planned: Natural  Airway  Additional Equipment:   Intra-op Plan:   Post-operative Plan:   Informed Consent: I have reviewed the patients History and Physical, chart, labs and discussed the procedure including the risks, benefits and alternatives for the proposed anesthesia with the patient or authorized representative who has indicated his/her understanding and acceptance.     Dental Advisory Given  Plan Discussed with: CRNA and Surgeon  Anesthesia Plan Comments:        Anesthesia Quick Evaluation

## 2022-07-03 ENCOUNTER — Encounter: Payer: Self-pay | Admitting: Gastroenterology

## 2022-07-03 LAB — SURGICAL PATHOLOGY

## 2023-03-07 IMAGING — CT CT ABD-PELV W/ CM
2 of 5 series · 16 of 46 positions shown, 18 images · IV contrast (APPLIED)
Comparison: None

CLINICAL DATA: Severe abdominal pain, tenderness throughout, prior
Caesarean section x4, had heavy vaginal bleeding since last night,
severe sudden onset of BILATERAL abdominal and pelvic pain at 5544
hours today

EXAM:
CT ABDOMEN AND PELVIS WITH CONTRAST
TECHNIQUE: Multidetector CT imaging of the abdomen and pelvis was performed
using the standard protocol following bolus administration of
intravenous contrast.

[Series 2: abdomen 5.0 · axial · 0.98mm/px · z∈[+961,+1411]mm · 13 of 104 slices shown, 15 images]
[im 7/104  soft-tissue]
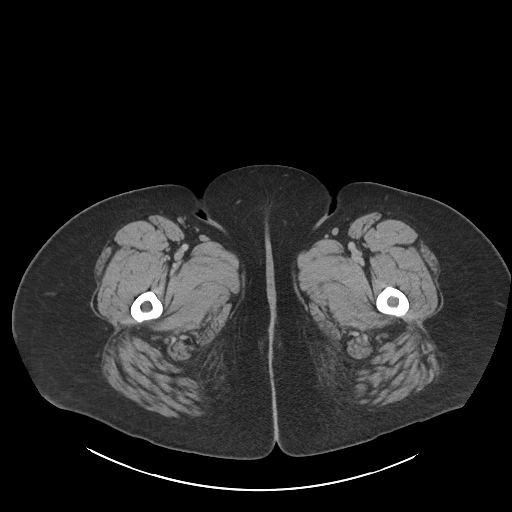
[im 7/104  bone]
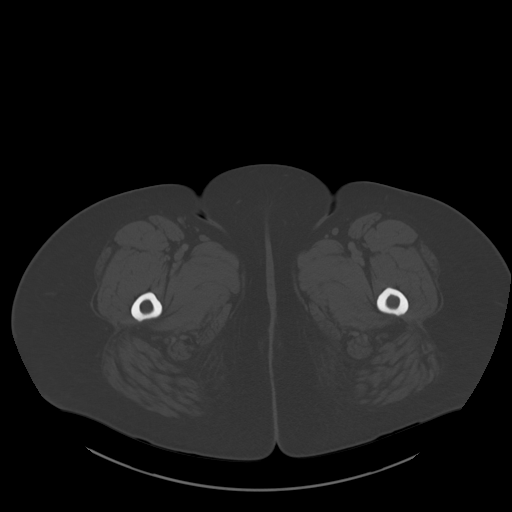
[im 14/104  soft-tissue]
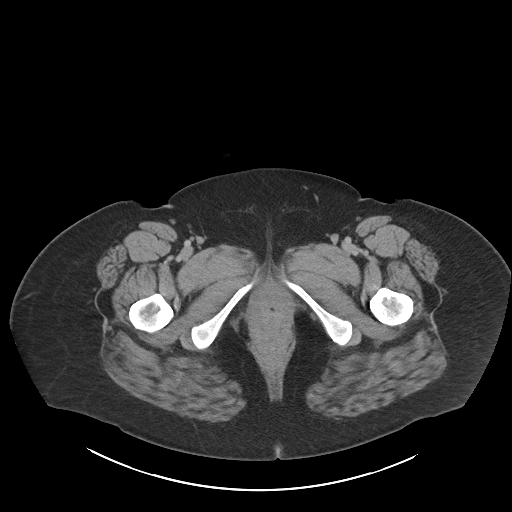
[im 21/104  soft-tissue]
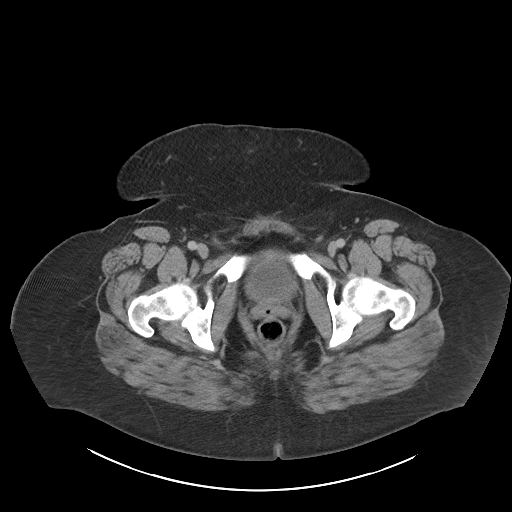
[im 28/104  soft-tissue]
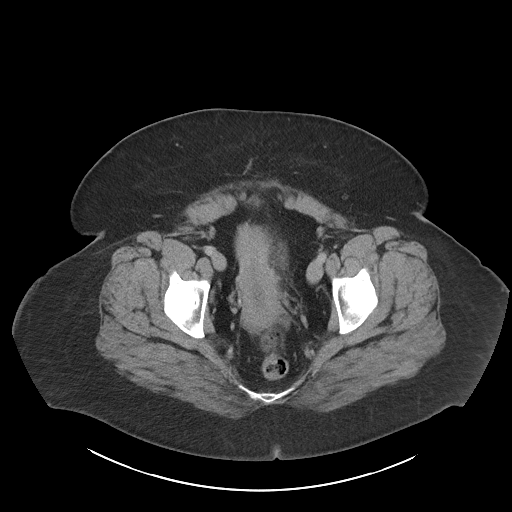
[im 35/104  soft-tissue]
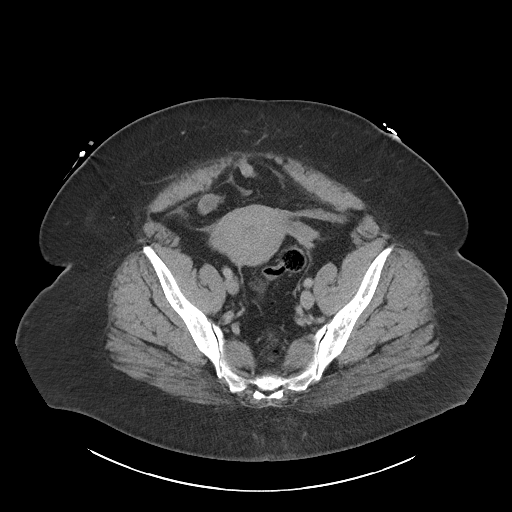
[im 42/104  soft-tissue]
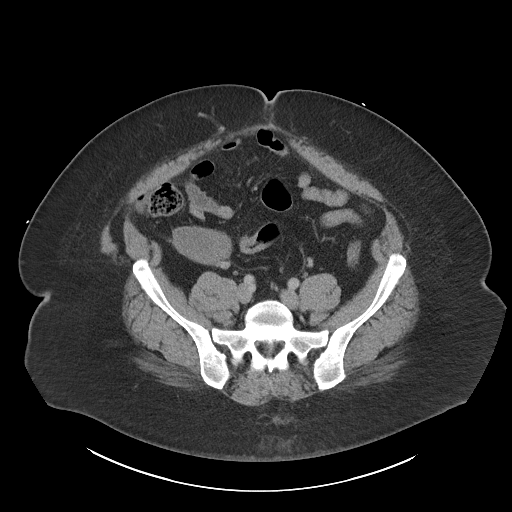
[im 55/104  soft-tissue]
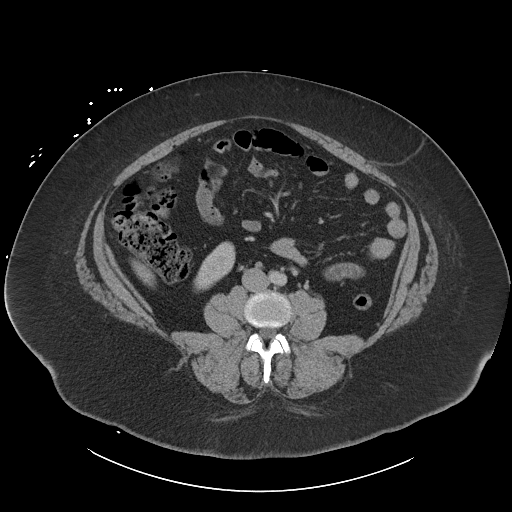
[im 62/104  soft-tissue]
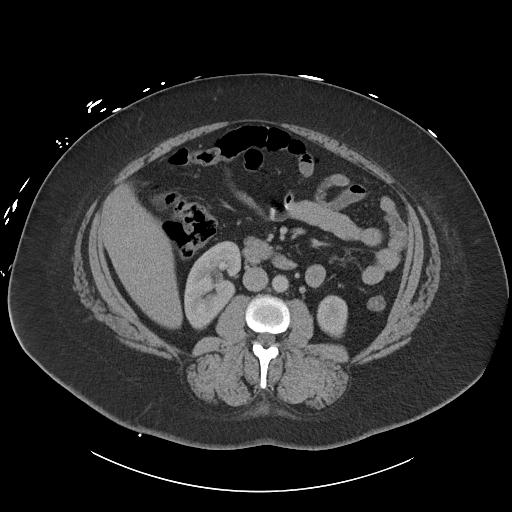
[im 69/104  soft-tissue]
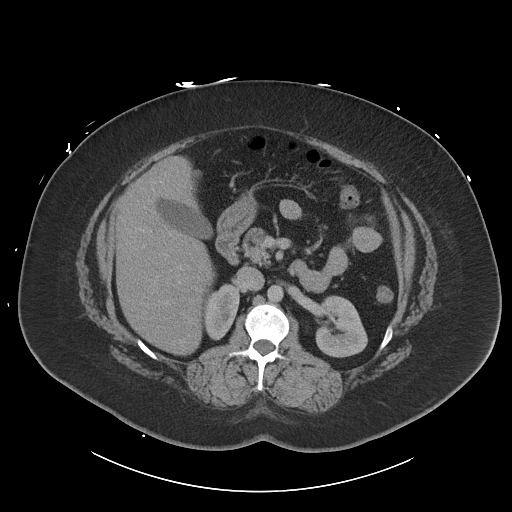
[im 69/104  bone]
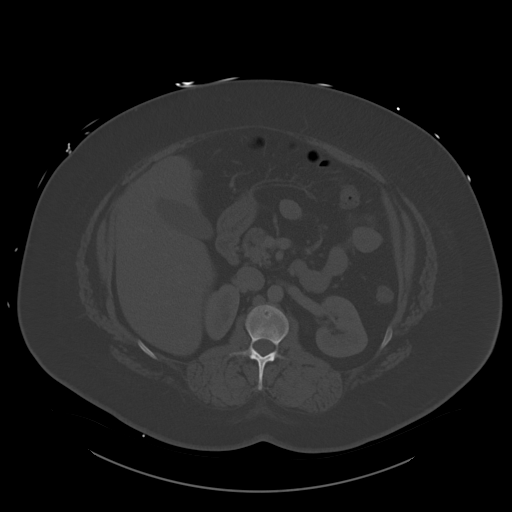
[im 76/104  soft-tissue]
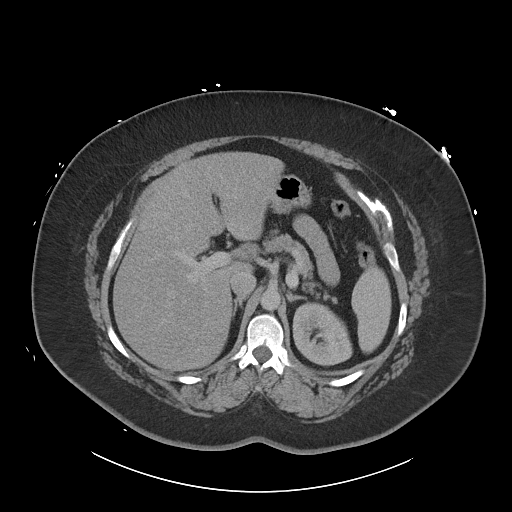
[im 83/104  soft-tissue]
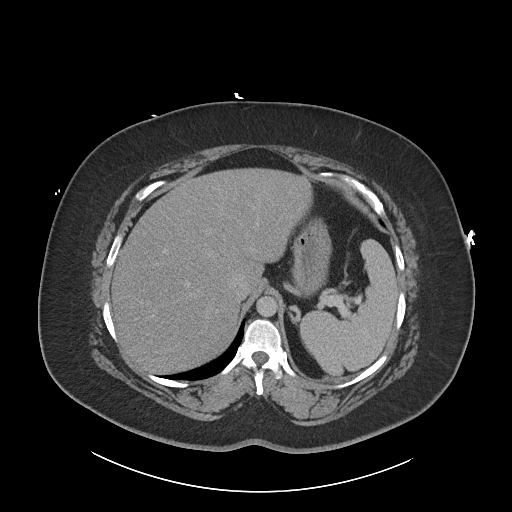
[im 90/104  soft-tissue]
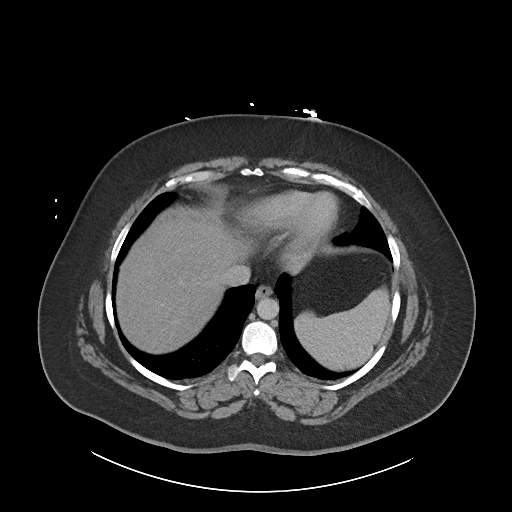
[im 97/104  soft-tissue]
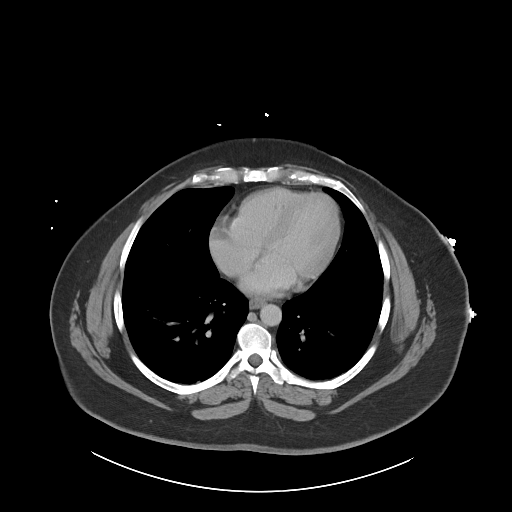

[Series 5: abdomen 3.0 mpr cor · coronal · 0.96mm/px · 3 of 129 slices shown]
[im 43/129  soft-tissue]
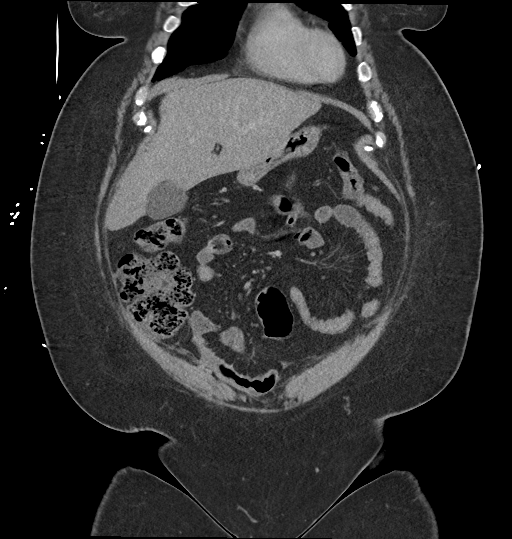
[im 57/129  soft-tissue]
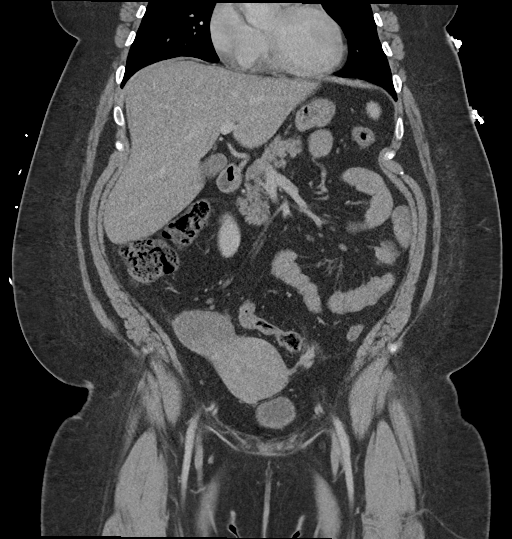
[im 72/129  soft-tissue]
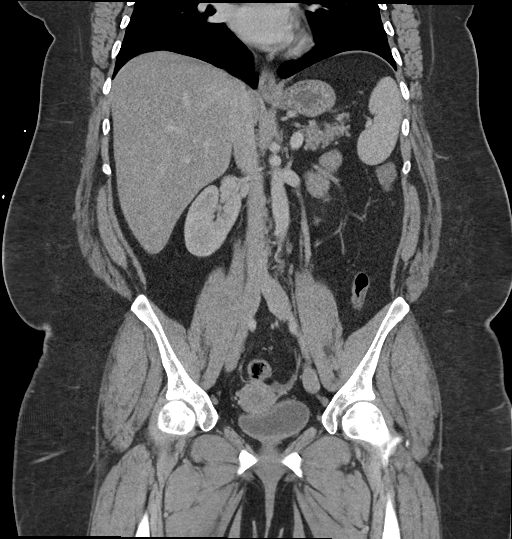

[16 of 46 positions shown; findings below may reference images not displayed]

RADIATION DOSE REDUCTION: This exam was performed according to the
departmental dose-optimization program which includes automated
exposure control, adjustment of the mA and/or kV according to
patient size and/or use of iterative reconstruction technique.

CONTRAST:  100mL OMNIPAQUE IOHEXOL 300 MG/ML SOLN IV. No oral
contrast.
FINDINGS: Lower chest: Lung bases clear

Hepatobiliary: Mild fatty infiltration of liver. Liver and
gallbladder otherwise normal appearance.

Pancreas: Normal appearance

Spleen: Normal appearance

Adrenals/Urinary Tract: Adrenal glands, kidneys, ureters, and
bladder normal appearance

Stomach/Bowel: Normal appendix. Stomach and bowel loops normal
appearance

Vascular/Lymphatic: Vascular structures patent. Mild atherosclerotic
calcification abdominal aorta. Aorta normal caliber. No adenopathy.

Reproductive: Asymmetric enlargement of RIGHT ovary 6.6 x 3.6 x
cm. LEFT ovary normal appearance. Nonspecific low-attenuation
centrally in lower uterine segment/cervix. No discrete uterine mass.

Other: Trace free fluid in pelvis and minimally RIGHT pericolic
gutter. Tiny umbilical hernia containing fat. No free air.

Musculoskeletal: Osseous structures unremarkable.
IMPRESSION: Asymmetric enlargement of the RIGHT ovary, cannot exclude mass;
pelvic sonography recommended to assess.

Trace free pelvic fluid in pelvis and RIGHT gutter.

Tiny umbilical hernia containing fat.

## 2023-06-26 ENCOUNTER — Other Ambulatory Visit: Payer: Self-pay | Admitting: Obstetrics and Gynecology
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
## 2023-07-12 ENCOUNTER — Other Ambulatory Visit: Payer: Managed Care, Other (non HMO)

## 2023-07-19 ENCOUNTER — Inpatient Hospital Stay
Admission: RE | Admit: 2023-07-19 | Discharge: 2023-07-19 | Disposition: A | Discharge status: Home / Self Care | Payer: Managed Care, Other (non HMO) | Source: Ambulatory Visit

## 2023-07-19 HISTORY — DX: Dysmenorrhea, unspecified: N94.6

## 2023-07-19 HISTORY — DX: Endometriosis, unspecified: N80.9

## 2023-07-19 NOTE — Patient Instructions (Addendum)
 Your procedure is scheduled on: Friday, March 14 Report to the Registration Desk on the 1st floor of the [facility] To find out your arrival time, please call [phone] between 1PM - 3PM on: Thursday, March 13 If your arrival time is 6:00 am, do not arrive before that time as the [facility] entrance doors do not open until 6:00 am.  REMEMBER: Instructions that are not followed completely may result in serious medical risk, up to and including death; or upon the discretion of your surgeon and anesthesiologist your surgery may need to be rescheduled.  Do not eat food after midnight the night before surgery.  No gum chewing or hard candies.  You may however, drink CLEAR liquids up to 2 hours before you are scheduled to arrive for your surgery. Do not drink anything within 2 hours of your scheduled arrival time.  Clear liquids include: - water  - apple juice without pulp - gatorade (not RED colors) - black coffee or tea (Do NOT add milk or creamers to the coffee or tea) Do NOT drink anything that is not on this list.  One week prior to surgery: starting March 7 Stop Anti-inflammatories (NSAIDS) such as Advil, Aleve, Ibuprofen, Motrin, Naproxen, Naprosyn and Aspirin based products such as Excedrin, Goody's Powder, BC Powder. Stop ANY OVER THE COUNTER supplements until after surgery. Stop elderberry, multiple vitamins, vitamin B.  You may however, continue to take Tylenol if needed for pain up until the day of surgery.  Continue taking all of your other prescription medications up until the day of surgery.  ON THE DAY OF SURGERY DO NOT TAKE ANY MEDICATIONS   No Alcohol for 24 hours before or after surgery.  No Smoking including e-cigarettes for 24 hours before surgery.  No chewable tobacco products for at least 6 hours before surgery.  No nicotine patches on the day of surgery.  Do not use any "recreational" drugs for at least a week (preferably 2 weeks) before your surgery.   Please be advised that the combination of cocaine and anesthesia may have negative outcomes, up to and including death. If you test positive for cocaine, your surgery will be cancelled.  On the morning of surgery brush your teeth with toothpaste and water, you may rinse your mouth with mouthwash if you wish. Do not swallow any toothpaste or mouthwash.  Use CHG Soap as directed on instruction sheet.  Do not wear jewelry, make-up, hairpins, clips or nail polish.  For welded (permanent) jewelry: bracelets, anklets, waist bands, etc.  Please have this removed prior to surgery.  If it is not removed, there is a chance that hospital personnel will need to cut it off on the day of surgery.  Do not wear lotions, powders, or perfumes.   Do not shave body hair from the neck down 48 hours before surgery.  Contact lenses, hearing aids and dentures may not be worn into surgery.  Do not bring valuables to the hospital. [facility] is not responsible for any missing/lost belongings or valuables.   Notify your doctor if there is any change in your medical condition (cold, fever, infection).  Wear comfortable clothing (specific to your surgery type) to the hospital.  After surgery, you can help prevent lung complications by doing breathing exercises.  Take deep breaths and cough every 1-2 hours. Your doctor may order a device called an Incentive Spirometer to help you take deep breaths. When coughing or sneezing, hold a pillow firmly against your incision with  both hands. This is called "splinting." Doing this helps protect your incision. It also decreases belly discomfort.  If you are being discharged the day of surgery, you will not be allowed to drive home. You will need a responsible individual to drive you home and stay with you for 24 hours after surgery.   If you are taking public transportation, you will need to have a responsible individual with you.  Please call the Pre-admissions Testing  Dept. at [phone] if you have any questions about these instructions.  Surgery Visitation Policy:  Patients having surgery or a procedure may have two visitors.  Children under the age of 10 must have an adult with them who is not the patient.  Temporary Visitor Restrictions Due to increasing cases of flu, RSV and COVID-19: Children ages 80 and under will not be able to visit patients in [facility] hospitals under most circumstances.     Preparing for Surgery with CHLORHEXIDINE GLUCONATE (CHG) Soap  Chlorhexidine Gluconate (CHG) Soap  o An antiseptic cleaner that kills germs and bonds with the skin to continue killing germs even after washing  o Used for showering the night before surgery and morning of surgery  Before surgery, you can play an important role by reducing the number of germs on your skin.  CHG (Chlorhexidine gluconate) soap is an antiseptic cleanser which kills germs and bonds with the skin to continue killing germs even after washing.  Please do not use if you have an allergy to CHG or antibacterial soaps. If your skin becomes reddened/irritated stop using the CHG.  1. Shower the NIGHT BEFORE SURGERY and the MORNING OF SURGERY with CHG soap.  2. If you choose to wash your hair, wash your hair first as usual with your normal shampoo.  3. After shampooing, rinse your hair and body thoroughly to remove the shampoo.  4. Use CHG as you would any other liquid soap. You can apply CHG directly to the skin and wash gently with a scrungie or a clean washcloth.  5. Apply the CHG soap to your body only from the neck down. Do not use on open wounds or open sores. Avoid contact with your eyes, ears, mouth, and genitals (private parts). Wash face and genitals (private parts) with your normal soap.  6. Wash thoroughly, paying special attention to the area where your surgery will be performed.  7. Thoroughly rinse your body with warm water.  8. Do not shower/wash with  your normal soap after using and rinsing off the CHG soap.  9. Pat yourself dry with a clean towel.  10. Wear clean pajamas to bed the night before surgery.  12. Place clean sheets on your bed the night of your first shower and do not sleep with pets.  13. Shower again with the CHG soap on the day of surgery prior to arriving at the hospital.  14. Do not apply any deodorants/lotions/powders.  15. Please wear clean clothes to the hospital.

## 2023-07-20 ENCOUNTER — Encounter
Admission: RE | Admit: 2023-07-20 | Discharge: 2023-07-20 | Disposition: A | Discharge status: Home / Self Care | Source: Ambulatory Visit | Attending: Obstetrics and Gynecology | Admitting: Obstetrics and Gynecology

## 2023-07-20 ENCOUNTER — Other Ambulatory Visit: Payer: Self-pay

## 2023-07-20 DIAGNOSIS — R102 Pelvic and perineal pain: Secondary | ICD-10-CM

## 2023-07-20 HISTORY — DX: Abnormal uterine and vaginal bleeding, unspecified: N93.9

## 2023-07-20 HISTORY — DX: Anemia, unspecified: D64.9

## 2023-07-23 ENCOUNTER — Encounter
Admission: RE | Admit: 2023-07-23 | Discharge: 2023-07-23 | Disposition: A | Discharge status: Home / Self Care | Source: Ambulatory Visit | Attending: Obstetrics and Gynecology | Admitting: Obstetrics and Gynecology

## 2023-07-23 DIAGNOSIS — R102 Pelvic and perineal pain: Secondary | ICD-10-CM | POA: Diagnosis not present

## 2023-07-23 DIAGNOSIS — Z01812 Encounter for preprocedural laboratory examination: Secondary | ICD-10-CM | POA: Diagnosis present

## 2023-07-23 LAB — TYPE AND SCREEN
ABO/RH(D): A NEG
Antibody Screen: NEGATIVE

## 2023-07-23 LAB — BASIC METABOLIC PANEL
Anion gap: 8 (ref 5–15)
BUN: 13 mg/dL (ref 6–20)
CO2: 28 mmol/L (ref 22–32)
Calcium: 9.4 mg/dL (ref 8.9–10.3)
Chloride: 101 mmol/L (ref 98–111)
Creatinine, Ser: 0.84 mg/dL (ref 0.44–1.00)
GFR, Estimated: >60 mL/min (ref >60)
Glucose, Bld: 85 mg/dL (ref 70–99)
Potassium: 4 mmol/L (ref 3.5–5.1)
Sodium: 137 mmol/L (ref 135–145)

## 2023-07-23 LAB — CBC
HCT: 35.2 % — ABNORMAL LOW (ref 36.0–46.0)
Hemoglobin: 11.3 g/dL — ABNORMAL LOW (ref 12.0–15.0)
MCH: 26.5 pg (ref 26.0–34.0)
MCHC: 32.1 g/dL (ref 30.0–36.0)
MCV: 82.6 fL (ref 80.0–100.0)
Platelets: 254 10*3/uL (ref 150–400)
RBC: 4.26 MIL/uL (ref 3.87–5.11)
RDW: 12.9 % (ref 11.5–15.5)
WBC: 5.4 10*3/uL (ref 4.0–10.5)
nRBC: 0 % (ref 0.0–0.2)

## 2023-07-26 MED ORDER — POVIDONE-IODINE 10 % EX SWAB
2.0000 | Freq: Once | CUTANEOUS | Status: AC
  Administered 2023-07-27: 2 via TOPICAL

## 2023-07-26 MED ORDER — CEFAZOLIN SODIUM-DEXTROSE 2-4 GM/100ML-% IV SOLN
2.0000 g | INTRAVENOUS | Status: AC
  Administered 2023-07-27 (×2): 2 g via INTRAVENOUS

## 2023-07-26 MED ORDER — GABAPENTIN 300 MG PO CAPS
300.0000 mg | ORAL_CAPSULE | ORAL | Status: AC
  Administered 2023-07-27: 300 mg via ORAL

## 2023-07-26 MED ORDER — LACTATED RINGERS IV SOLN: INTRAVENOUS | Status: DC

## 2023-07-26 MED ORDER — ACETAMINOPHEN 500 MG PO TABS
1000.0000 mg | ORAL_TABLET | ORAL | Status: AC
  Administered 2023-07-27: 1000 mg via ORAL

## 2023-07-26 MED ORDER — CHLORHEXIDINE GLUCONATE 0.12 % MT SOLN
15.0000 mL | Freq: Once | OROMUCOSAL | Status: AC
  Administered 2023-07-27: 15 mL via OROMUCOSAL

## 2023-07-26 MED ORDER — ORAL CARE MOUTH RINSE: 15.0000 mL | Freq: Once | OROMUCOSAL | Status: AC

## 2023-07-27 ENCOUNTER — Other Ambulatory Visit: Payer: Self-pay

## 2023-07-27 ENCOUNTER — Observation Stay
Admission: RE | Admit: 2023-07-27 | Discharge: 2023-07-28 | Disposition: A | Discharge status: Home / Self Care | Payer: Managed Care, Other (non HMO) | Source: Ambulatory Visit | Attending: Obstetrics and Gynecology | Admitting: Obstetrics and Gynecology

## 2023-07-27 ENCOUNTER — Encounter: Payer: Self-pay | Admitting: Obstetrics and Gynecology

## 2023-07-27 ENCOUNTER — Ambulatory Visit: Admitting: Registered Nurse

## 2023-07-27 ENCOUNTER — Encounter
Admission: RE | Disposition: A | Discharge status: Home / Self Care | Payer: Self-pay | Source: Ambulatory Visit | Attending: Obstetrics and Gynecology

## 2023-07-27 ENCOUNTER — Ambulatory Visit: Payer: Self-pay | Admitting: Urgent Care

## 2023-07-27 DIAGNOSIS — R102 Pelvic and perineal pain: Principal | ICD-10-CM

## 2023-07-27 DIAGNOSIS — N946 Dysmenorrhea, unspecified: Secondary | ICD-10-CM | POA: Diagnosis present

## 2023-07-27 DIAGNOSIS — N920 Excessive and frequent menstruation with regular cycle: Secondary | ICD-10-CM | POA: Insufficient documentation

## 2023-07-27 DIAGNOSIS — N80549 Endometriosis of the appendix, unspecified depth: Secondary | ICD-10-CM | POA: Diagnosis not present

## 2023-07-27 DIAGNOSIS — N809 Endometriosis, unspecified: Secondary | ICD-10-CM | POA: Diagnosis present

## 2023-07-27 DIAGNOSIS — N939 Abnormal uterine and vaginal bleeding, unspecified: Secondary | ICD-10-CM | POA: Insufficient documentation

## 2023-07-27 DIAGNOSIS — Z01812 Encounter for preprocedural laboratory examination: Secondary | ICD-10-CM

## 2023-07-27 HISTORY — PX: XI ROBOTIC LAPAROSCOPIC ASSISTED APPENDECTOMY: SHX6877

## 2023-07-27 HISTORY — PX: CYSTOSCOPY: SHX5120

## 2023-07-27 HISTORY — PX: ROBOTIC ASSISTED LAPAROSCOPIC HYSTERECTOMY AND SALPINGECTOMY: SHX6379

## 2023-07-27 HISTORY — PX: ROBOTIC ASSISTED LAPAROSCOPIC LYSIS OF ADHESION: SHX6080

## 2023-07-27 LAB — CBC
HCT: 35.3 % — ABNORMAL LOW (ref 36.0–46.0)
Hemoglobin: 11.6 g/dL — ABNORMAL LOW (ref 12.0–15.0)
MCH: 26.8 pg (ref 26.0–34.0)
MCHC: 32.9 g/dL (ref 30.0–36.0)
MCV: 81.5 fL (ref 80.0–100.0)
Platelets: 269 10*3/uL (ref 150–400)
RBC: 4.33 MIL/uL (ref 3.87–5.11)
RDW: 12.9 % (ref 11.5–15.5)
WBC: 13 10*3/uL — ABNORMAL HIGH (ref 4.0–10.5)
nRBC: 0 % (ref 0.0–0.2)

## 2023-07-27 LAB — BASIC METABOLIC PANEL
Anion gap: 8 (ref 5–15)
BUN: 16 mg/dL (ref 6–20)
CO2: 25 mmol/L (ref 22–32)
Calcium: 8.7 mg/dL — ABNORMAL LOW (ref 8.9–10.3)
Chloride: 103 mmol/L (ref 98–111)
Creatinine, Ser: 0.95 mg/dL (ref 0.44–1.00)
GFR, Estimated: >60 mL/min (ref >60)
Glucose, Bld: 179 mg/dL — ABNORMAL HIGH (ref 70–99)
Potassium: 3.9 mmol/L (ref 3.5–5.1)
Sodium: 136 mmol/L (ref 135–145)

## 2023-07-27 LAB — POCT PREGNANCY, URINE: Preg Test, Ur: NEGATIVE

## 2023-07-27 SURGERY — XI ROBOTIC ASSISTED LAPAROSCOPIC HYSTERECTOMY AND SALPINGECTOMY
Anesthesia: General | Site: Abdomen

## 2023-07-27 MED ORDER — MIDAZOLAM HCL 2 MG/2ML IJ SOLN
INTRAMUSCULAR | Status: DC | PRN
  Administered 2023-07-27: 2 mg via INTRAVENOUS

## 2023-07-27 MED ORDER — IBUPROFEN 600 MG PO TABS
600.0000 mg | ORAL_TABLET | Freq: Four times a day (QID) | ORAL | Status: DC
  Administered 2023-07-28: 600 mg via ORAL

## 2023-07-27 MED ORDER — DEXMEDETOMIDINE HCL IN NACL 80 MCG/20ML IV SOLN
INTRAVENOUS | Status: DC | PRN
  Administered 2023-07-27: 8 ug via INTRAVENOUS

## 2023-07-27 MED ORDER — DROPERIDOL 2.5 MG/ML IJ SOLN: 0.6250 mg | Freq: Once | INTRAMUSCULAR | Status: DC | PRN

## 2023-07-27 MED ORDER — GLYCOPYRROLATE 0.2 MG/ML IJ SOLN
INTRAMUSCULAR | Status: AC
  Filled 2023-07-27: qty 1

## 2023-07-27 MED ORDER — METHYLENE BLUE (ANTIDOTE) 1 % IV SOLN
INTRAVENOUS | Status: AC
  Filled 2023-07-27: qty 10

## 2023-07-27 MED ORDER — MULTIVITAMINS PO CAPS
1.0000 | ORAL_CAPSULE | Freq: Every day | ORAL | Status: DC
Start: 2023-07-27 — End: 2023-07-27

## 2023-07-27 MED ORDER — PROPOFOL 10 MG/ML IV BOLUS
INTRAVENOUS | Status: AC
  Filled 2023-07-27: qty 20

## 2023-07-27 MED ORDER — BISACODYL 5 MG PO TBEC: 5.0000 mg | DELAYED_RELEASE_TABLET | Freq: Every day | ORAL | Status: DC | PRN

## 2023-07-27 MED ORDER — FENTANYL CITRATE (PF) 100 MCG/2ML IJ SOLN: 25.0000 ug | INTRAMUSCULAR | Status: DC | PRN

## 2023-07-27 MED ORDER — 0.9 % SODIUM CHLORIDE (POUR BTL) OPTIME
TOPICAL | Status: DC | PRN
  Administered 2023-07-27: 500 mL

## 2023-07-27 MED ORDER — KETOROLAC TROMETHAMINE 30 MG/ML IJ SOLN
30.0000 mg | Freq: Four times a day (QID) | INTRAMUSCULAR | Status: DC
  Administered 2023-07-27 – 2023-07-28 (×3): 30 mg via INTRAVENOUS
  Filled 2023-07-27 (×4): qty 1

## 2023-07-27 MED ORDER — MIDAZOLAM HCL 2 MG/2ML IJ SOLN
INTRAMUSCULAR | Status: AC
  Filled 2023-07-27: qty 2

## 2023-07-27 MED ORDER — SUGAMMADEX SODIUM 200 MG/2ML IV SOLN
INTRAVENOUS | Status: AC
  Filled 2023-07-27: qty 4

## 2023-07-27 MED ORDER — DEXAMETHASONE SODIUM PHOSPHATE 10 MG/ML IJ SOLN
INTRAMUSCULAR | Status: AC
  Filled 2023-07-27: qty 1

## 2023-07-27 MED ORDER — STERILE WATER FOR IRRIGATION IR SOLN: Status: DC | PRN

## 2023-07-27 MED ORDER — ROCURONIUM BROMIDE 10 MG/ML (PF) SYRINGE
PREFILLED_SYRINGE | INTRAVENOUS | Status: AC
  Filled 2023-07-27: qty 10

## 2023-07-27 MED ORDER — ROCURONIUM BROMIDE 100 MG/10ML IV SOLN
INTRAVENOUS | Status: DC | PRN
  Administered 2023-07-27 (×2): 30 mg via INTRAVENOUS
  Administered 2023-07-27: 60 mg via INTRAVENOUS
  Administered 2023-07-27 (×5): 20 mg via INTRAVENOUS

## 2023-07-27 MED ORDER — ONDANSETRON HCL 4 MG/2ML IJ SOLN: 4.0000 mg | Freq: Four times a day (QID) | INTRAMUSCULAR | Status: DC | PRN

## 2023-07-27 MED ORDER — LACTATED RINGERS IV SOLN: INTRAVENOUS | Status: DC | PRN

## 2023-07-27 MED ORDER — ADULT MULTIVITAMIN W/MINERALS CH
1.0000 | ORAL_TABLET | Freq: Every day | ORAL | Status: DC
  Administered 2023-07-28: 1 via ORAL
  Filled 2023-07-27: qty 1

## 2023-07-27 MED ORDER — GLYCOPYRROLATE 0.2 MG/ML IJ SOLN
INTRAMUSCULAR | Status: DC | PRN
  Administered 2023-07-27: .2 mg via INTRAVENOUS

## 2023-07-27 MED ORDER — LACTATED RINGERS IV SOLN: INTRAVENOUS | Status: DC

## 2023-07-27 MED ORDER — SUGAMMADEX SODIUM 200 MG/2ML IV SOLN
INTRAVENOUS | Status: DC | PRN
  Administered 2023-07-27: 100 mg via INTRAVENOUS
  Administered 2023-07-27: 400 mg via INTRAVENOUS
  Administered 2023-07-27: 100 mg via INTRAVENOUS

## 2023-07-27 MED ORDER — BUPIVACAINE HCL (PF) 0.5 % IJ SOLN
INTRAMUSCULAR | Status: DC | PRN
  Administered 2023-07-27: 20 mL

## 2023-07-27 MED ORDER — KETAMINE HCL 50 MG/5ML IJ SOSY
PREFILLED_SYRINGE | INTRAMUSCULAR | Status: AC
  Filled 2023-07-27: qty 5

## 2023-07-27 MED ORDER — PROPOFOL 10 MG/ML IV BOLUS
INTRAVENOUS | Status: DC | PRN
Start: 2023-07-27 — End: 2023-07-27
  Administered 2023-07-27: 200 mg via INTRAVENOUS

## 2023-07-27 MED ORDER — CEFAZOLIN SODIUM-DEXTROSE 2-4 GM/100ML-% IV SOLN
INTRAVENOUS | Status: AC
  Filled 2023-07-27: qty 100

## 2023-07-27 MED ORDER — ACETAMINOPHEN 500 MG PO TABS
ORAL_TABLET | ORAL | Status: AC
  Filled 2023-07-27: qty 2

## 2023-07-27 MED ORDER — GABAPENTIN 300 MG PO CAPS
ORAL_CAPSULE | ORAL | Status: AC
  Filled 2023-07-27: qty 1

## 2023-07-27 MED ORDER — BUPIVACAINE HCL (PF) 0.5 % IJ SOLN
INTRAMUSCULAR | Status: AC
  Filled 2023-07-27: qty 30

## 2023-07-27 MED ORDER — DOCUSATE SODIUM 100 MG PO CAPS
100.0000 mg | ORAL_CAPSULE | Freq: Two times a day (BID) | ORAL | Status: DC
  Administered 2023-07-27 – 2023-07-28 (×2): 100 mg via ORAL
  Filled 2023-07-27 (×2): qty 1

## 2023-07-27 MED ORDER — MENTHOL 3 MG MT LOZG
1.0000 | LOZENGE | OROMUCOSAL | Status: DC | PRN
  Administered 2023-07-27: 3 mg via ORAL
  Filled 2023-07-27: qty 9

## 2023-07-27 MED ORDER — KETAMINE HCL 50 MG/5ML IJ SOSY
PREFILLED_SYRINGE | INTRAMUSCULAR | Status: DC | PRN
  Administered 2023-07-27 (×2): 25 mg via INTRAVENOUS

## 2023-07-27 MED ORDER — FENTANYL CITRATE (PF) 100 MCG/2ML IJ SOLN
INTRAMUSCULAR | Status: DC | PRN
  Administered 2023-07-27: 75 ug via INTRAVENOUS
  Administered 2023-07-27: 25 ug via INTRAVENOUS

## 2023-07-27 MED ORDER — ACETAMINOPHEN 10 MG/ML IV SOLN
INTRAVENOUS | Status: DC | PRN
  Administered 2023-07-27: 1000 mg via INTRAVENOUS

## 2023-07-27 MED ORDER — FENTANYL CITRATE (PF) 100 MCG/2ML IJ SOLN
INTRAMUSCULAR | Status: AC
  Filled 2023-07-27: qty 2

## 2023-07-27 MED ORDER — METRONIDAZOLE 500 MG/100ML IV SOLN
500.0000 mg | Freq: Once | INTRAVENOUS | Status: DC
  Filled 2023-07-27: qty 100

## 2023-07-27 MED ORDER — ALUM & MAG HYDROXIDE-SIMETH 200-200-20 MG/5ML PO SUSP: 30.0000 mL | ORAL | Status: DC | PRN

## 2023-07-27 MED ORDER — METRONIDAZOLE IVPB CUSTOM: 1000.0000 mg | Freq: Once | INTRAVENOUS | Status: DC

## 2023-07-27 MED ORDER — ONDANSETRON HCL 4 MG PO TABS
4.0000 mg | ORAL_TABLET | Freq: Four times a day (QID) | ORAL | Status: DC | PRN
Start: 2023-07-27 — End: 2023-07-28

## 2023-07-27 MED ORDER — SIMETHICONE 80 MG PO CHEW
80.0000 mg | CHEWABLE_TABLET | Freq: Four times a day (QID) | ORAL | Status: DC | PRN
  Administered 2023-07-27 – 2023-07-28 (×4): 80 mg via ORAL
  Filled 2023-07-27 (×4): qty 1

## 2023-07-27 MED ORDER — DEXAMETHASONE SODIUM PHOSPHATE 10 MG/ML IJ SOLN
INTRAMUSCULAR | Status: DC | PRN
  Administered 2023-07-27: 10 mg via INTRAVENOUS

## 2023-07-27 MED ORDER — LIDOCAINE HCL (CARDIAC) PF 100 MG/5ML IV SOSY
PREFILLED_SYRINGE | INTRAVENOUS | Status: DC | PRN
  Administered 2023-07-27: 50 mg via INTRAVENOUS

## 2023-07-27 MED ORDER — METRONIDAZOLE 500 MG/100ML IV SOLN
500.0000 mg | Freq: Once | INTRAVENOUS | Status: AC
  Administered 2023-07-27 (×2): 500 mg via INTRAVENOUS
  Filled 2023-07-27: qty 100

## 2023-07-27 MED ORDER — CEFAZOLIN SODIUM 1 G IJ SOLR
INTRAMUSCULAR | Status: AC
  Filled 2023-07-27: qty 20

## 2023-07-27 MED ORDER — GABAPENTIN 100 MG PO CAPS
100.0000 mg | ORAL_CAPSULE | Freq: Every day | ORAL | Status: DC
  Administered 2023-07-27: 100 mg via ORAL
  Filled 2023-07-27: qty 1

## 2023-07-27 MED ORDER — ONDANSETRON HCL 4 MG/2ML IJ SOLN
INTRAMUSCULAR | Status: DC | PRN
  Administered 2023-07-27 (×2): 4 mg via INTRAVENOUS

## 2023-07-27 MED ORDER — CHLORHEXIDINE GLUCONATE 0.12 % MT SOLN
OROMUCOSAL | Status: AC
  Filled 2023-07-27: qty 15

## 2023-07-27 MED ORDER — ACETAMINOPHEN 10 MG/ML IV SOLN
INTRAVENOUS | Status: AC
  Filled 2023-07-27: qty 100

## 2023-07-27 MED ORDER — OXYCODONE HCL 5 MG PO TABS
5.0000 mg | ORAL_TABLET | ORAL | Status: DC | PRN
  Administered 2023-07-27 – 2023-07-28 (×5): 5 mg via ORAL
  Filled 2023-07-27: qty 1
  Filled 2023-07-27: qty 2
  Filled 2023-07-27 (×4): qty 1

## 2023-07-27 MED ORDER — ONDANSETRON HCL 4 MG/2ML IJ SOLN
INTRAMUSCULAR | Status: AC
  Filled 2023-07-27: qty 2

## 2023-07-27 MED ORDER — LIDOCAINE HCL (PF) 2 % IJ SOLN
INTRAMUSCULAR | Status: AC
  Filled 2023-07-27: qty 5

## 2023-07-27 SURGICAL SUPPLY — 60 items
APPLICATOR ARISTA FLEXITIP XL (MISCELLANEOUS) IMPLANT
APPLIER CLIP XI LRG REUSE DVNC (INSTRUMENTS) IMPLANT
BAG URINE DRAIN 2000ML AR STRL (UROLOGICAL SUPPLIES) ×3 IMPLANT
BLADE SURG SZ11 CARB STEEL (BLADE) ×3 IMPLANT
CANNULA CAP OBTURATR AIRSEAL 8 (CAP) ×3 IMPLANT
CATH URTH 16FR FL 2W BLN LF (CATHETERS) ×3 IMPLANT
CLIP APPLIE XI LRG REUSE DVNC (INSTRUMENTS) IMPLANT
CLIP LIGATING HEM O LOK PURPLE (MISCELLANEOUS) IMPLANT
COVER TIP SHEARS 8 DVNC (MISCELLANEOUS) ×3 IMPLANT
DERMABOND ADVANCED .7 DNX12 (GAUZE/BANDAGES/DRESSINGS) ×3 IMPLANT
DRAPE ARM DVNC X/XI (DISPOSABLE) ×12 IMPLANT
DRAPE COLUMN DVNC XI (DISPOSABLE) ×3 IMPLANT
DRAPE SHEET LG 3/4 BI-LAMINATE (DRAPES) ×3 IMPLANT
DRIVER NDL MEGA 8 DVNC XI (INSTRUMENTS) ×3 IMPLANT
DRIVER NDLE MEGA DVNC XI (INSTRUMENTS) ×3 IMPLANT
DRSG TELFA 4X3 1S NADH ST (GAUZE/BANDAGES/DRESSINGS) IMPLANT
ELECT REM PT RETURN 9FT ADLT (ELECTROSURGICAL) ×3 IMPLANT
ELECTRODE REM PT RTRN 9FT ADLT (ELECTROSURGICAL) ×3 IMPLANT
FORCEPS BPLR 8 MD DVNC XI (FORCEP) IMPLANT
FORCEPS BPLR FENES DVNC XI (FORCEP) ×3 IMPLANT
GAUZE 4X4 16PLY ~~LOC~~+RFID DBL (SPONGE) ×3 IMPLANT
GLOVE BIO SURGEON STRL SZ7 (GLOVE) ×12 IMPLANT
GLOVE BIOGEL PI IND STRL 7.5 (GLOVE) ×12 IMPLANT
GOWN STRL REUS W/ TWL LRG LVL3 (GOWN DISPOSABLE) ×18 IMPLANT
HEMOSTAT ARISTA ABSORB 3G PWDR (HEMOSTASIS) IMPLANT
IRRIGATOR SUCT 8 DISP DVNC XI (IRRIGATION / IRRIGATOR) IMPLANT
KIT PINK PAD W/HEAD ARE REST (MISCELLANEOUS) ×3 IMPLANT
KIT PINK PAD W/HEAD ARM REST (MISCELLANEOUS) ×3 IMPLANT
LABEL OR SOLS (LABEL) ×3 IMPLANT
MANIFOLD NEPTUNE II (INSTRUMENTS) ×3 IMPLANT
MANIPULATOR VCARE STD CRV RETR (MISCELLANEOUS) IMPLANT
NS IRRIG 1000ML POUR BTL (IV SOLUTION) ×3 IMPLANT
OBTURATOR OPTICAL STND 8 DVNC (TROCAR) ×3 IMPLANT
OBTURATOR OPTICALSTD 8 DVNC (TROCAR) ×3 IMPLANT
OCCLUDER COLPOPNEUMO (BALLOONS) ×3 IMPLANT
PACK GYN LAPAROSCOPIC (MISCELLANEOUS) ×3 IMPLANT
PAD OB MATERNITY 11 LF (PERSONAL CARE ITEMS) ×3 IMPLANT
PAD PREP OB/GYN DISP 24X41 (PERSONAL CARE ITEMS) ×3 IMPLANT
RETRACTOR RING XSMALL (MISCELLANEOUS) IMPLANT
RTRCTR WOUND ALEXIS 13CM XS SH (MISCELLANEOUS) ×3 IMPLANT
SCISSORS MNPLR CVD DVNC XI (INSTRUMENTS) ×3 IMPLANT
SCRUB CHG 4% DYNA-HEX 4OZ (MISCELLANEOUS) ×3 IMPLANT
SEAL UNIV 5-12 XI (MISCELLANEOUS) ×9 IMPLANT
SET CYSTO W/LG BORE CLAMP LF (SET/KITS/TRAYS/PACK) ×3 IMPLANT
SET TUBE FILTERED XL AIRSEAL (SET/KITS/TRAYS/PACK) ×3 IMPLANT
SOL ELECTROSURG ANTI STICK (MISCELLANEOUS) ×3 IMPLANT
SOL PREP PVP 2OZ (MISCELLANEOUS) ×3 IMPLANT
SOLUTION ELECTROSURG ANTI STCK (MISCELLANEOUS) ×3 IMPLANT
SOLUTION PREP PVP 2OZ (MISCELLANEOUS) ×3 IMPLANT
SURGILUBE 2OZ TUBE FLIPTOP (MISCELLANEOUS) ×3 IMPLANT
SUT MNCRL 4-0 27 PS-2 XMFL (SUTURE) ×3 IMPLANT
SUT SILK 2 0 SH (SUTURE) IMPLANT
SUT STRATA 2-0 30 CT-2 (SUTURE) IMPLANT
SUT VIC AB 0 CT2 27 (SUTURE) ×6 IMPLANT
SUT VLOC 90 0 VL 30X27 GL22 (SUTURE) IMPLANT
SUTURE MNCRL 4-0 27XMF (SUTURE) ×3 IMPLANT
SYR 50ML LL SCALE MARK (SYRINGE) ×3 IMPLANT
SYS RETRIEVAL 5MM INZII UNIV (BASKET) ×3 IMPLANT
SYSTEM RETRIEVL 5MM INZII UNIV (BASKET) IMPLANT
WATER STERILE IRR 500ML POUR (IV SOLUTION) ×3 IMPLANT

## 2023-07-27 NOTE — Op Note (Addendum)
 Robotic appendectomy  Pre-operative Diagnosis: Endometriosis, extensive with involvement of the appendix.  Post-operative Diagnosis: same.    Surgeon: Campbell Lerner, M.D., [facility]  Anesthesia: General Endotracheal  Findings: During intraoperative consultation with Dr. Dalbert Garnet, requesting removal of the appendix to fully complete management of her extensive endometriosis which has been significantly symptomatic.  Dr. Dalbert Garnet is already completed the hysterectomy, and well with cuff is yet to be closed I was able to complete intraoperative consultation and discussed with patient's daughter plan to proceed with appendectomy.  Estimated Blood Loss: 5 mL, limited to the appendectomy alone.         Specimens:  Appendix           Complications: none              Procedure Details   Using a fenestrated bipolar grasper, [place] forceps and monopolar scissors I proceeded with dissecting out the soft tissues adjacent to the cecum and appendix to fully identify the appendix, and mobilize it to the cecal junction.  The mesoappendix was carefully divided utilizing bipolar cautery, monopolar cautery and scissors. With the appendiceal cecal junction fully isolated, the appendiceal base was crimped, a double ligature of 2-0 silk was utilized to ligate the base of the appendix, and the appendix was divided with monopolar scissors, stool present within the appendiceal lumen was then aspirated with the suction irrigator.  Maintaining excellent control.  The appendiceal mucosa was then fulgurated with the same. I utilized a 2-0 silk to dunk the appendiceal stump into the cecal serosa, burying the stump with a pursestring of suture. Via the open vaginal cuff we deployed a 5 mm laparoscopic specimen bag and withdrew the appendix via the vaginal orifice. I then returned the robotic console to Dr. Dalbert Garnet to complete the vaginal cuff closure. Patient remained stable throughout my portion of this procedure. We  discussed adding a dose of Flagyl to her preoperative Ancef.    Campbell Lerner M.D., [facility] 07/27/2023 12:48 PM

## 2023-07-27 NOTE — Discharge Summary (Signed)
 1 Day Post-Op       Procedure(s): XI ROBOTIC ASSISTED LAPAROSCOPIC HYSTERECTOMY, EXCISION OF ENDOMETRIOSIS (Bilateral) LYSIS, ADHESIONS, ROBOT-ASSISTED, LAPAROSCOPIC (N/A) CYSTOSCOPY (N/A) APPENDECTOMY, ROBOT-ASSISTED, LAPAROSCOPIC (N/A) Subjective: The patient is doing well.  No nausea or vomiting. Pain is adequately controlled. Moving about well.  Objective: Vital signs in last 24 hours: Temp:  [97.5 F (36.4 C)-99.1 F (37.3 C)] 98 F (36.7 C) (03/15 1100) Pulse Rate:  [75-107] 75 (03/15 1100) Resp:  [16-19] 18 (03/15 1100) BP: (126-153)/(64-86) 127/64 (03/15 1100) SpO2:  [95 %-100 %] 99 % (03/15 0800)  Intake/Output  Intake/Output Summary (Last 24 hours) at 07/28/2023 1252 Last data filed at 07/28/2023 1200 Gross per 24 hour  Intake 3568 ml  Output 3125 ml  Net 443 ml    Physical Exam:  General: Alert and oriented. CV: RRR Lungs: Clear bilaterally. GI: Soft, Nondistended. Incisions: Clean and dry. Urine: Clear Extremities: Nontender, no erythema, no edema.  Lab Results: Recent Labs    07/27/23 1559  HGB 11.6*  HCT 35.3*  WBC 13.0*  PLT 269                 Results for orders placed or performed during the hospital encounter of 07/27/23 (from the past 24 hours)  CBC     Status: Abnormal   Collection Time: 07/27/23  3:59 PM  Result Value Ref Range   WBC 13.0 (H) 4.0 - 10.5 K/uL   RBC 4.33 3.87 - 5.11 MIL/uL   Hemoglobin 11.6 (L) 12.0 - 15.0 g/dL   HCT 91.4 (L) 78.2 - 95.6 %   MCV 81.5 80.0 - 100.0 fL   MCH 26.8 26.0 - 34.0 pg   MCHC 32.9 30.0 - 36.0 g/dL   RDW 21.3 08.6 - 57.8 %   Platelets 269 150 - 400 K/uL   nRBC 0.0 0.0 - 0.2 %  Basic metabolic panel     Status: Abnormal   Collection Time: 07/27/23  3:59 PM  Result Value Ref Range   Sodium 136 135 - 145 mmol/L   Potassium 3.9 3.5 - 5.1 mmol/L   Chloride 103 98 - 111 mmol/L   CO2 25 22 - 32 mmol/L   Glucose, Bld 179 (H) 70 - 99 mg/dL   BUN 16 6 - 20 mg/dL   Creatinine, Ser 4.69 0.44 - 1.00  mg/dL   Calcium 8.7 (L) 8.9 - 10.3 mg/dL   GFR, Estimated >62 >95 mL/min   Anion gap 8 5 - 15    Assessment/Plan: 1 Day Post-Op       Procedure(s): XI ROBOTIC ASSISTED LAPAROSCOPIC HYSTERECTOMY, EXCISION OF ENDOMETRIOSIS (Bilateral) LYSIS, ADHESIONS, ROBOT-ASSISTED, LAPAROSCOPIC (N/A) CYSTOSCOPY (N/A) APPENDECTOMY, ROBOT-ASSISTED, LAPAROSCOPIC (N/A)  -Ambulate, Incentive spirometry -Advance diet as tolerated -Discharge home today anticipated     Christeen Douglas, MD   LOS: 0 days   Christeen Douglas 07/28/2023, 12:52 PM

## 2023-07-27 NOTE — Interval H&P Note (Signed)
 History and Physical Interval Note:  07/27/2023 7:20 AM  Ashlee Curtis  has presented today for surgery, with the diagnosis of menorrhagia, dysmenorrhea.  The various methods of treatment have been discussed with the patient and family. After consideration of risks, benefits and other options for treatment, the patient has consented to  Procedure(s): XI ROBOTIC ASSISTED LAPAROSCOPIC HYSTERECTOMY AND SALPINGECTOMY, EXCISION OF ENDOMETRIOSIS (Bilateral) LYSIS, ADHESIONS, ROBOT-ASSISTED, LAPAROSCOPIC (N/A) CYSTOSCOPY (N/A) as a surgical intervention.  The patient's history has been reviewed, patient examined, no change in status, stable for surgery.  I have reviewed the patient's chart and labs.  Questions were answered to the patient's satisfaction.     Ashlee Curtis

## 2023-07-27 NOTE — Transfer of Care (Signed)
 Immediate Anesthesia Transfer of Care Note  Patient: Ashlee Curtis  Procedure(s) Performed: XI ROBOTIC ASSISTED LAPAROSCOPIC HYSTERECTOMY, EXCISION OF ENDOMETRIOSIS (Bilateral: Abdomen) LYSIS, ADHESIONS, ROBOT-ASSISTED, LAPAROSCOPIC (Abdomen) CYSTOSCOPY APPENDECTOMY, ROBOT-ASSISTED, LAPAROSCOPIC (Abdomen)  Patient Location: PACU  Anesthesia Type:General  Level of Consciousness: drowsy and patient cooperative  Airway & Oxygen Therapy: Patient Spontanous Breathing and Patient connected to face mask oxygen  Post-op Assessment: Report given to RN and Post -op Vital signs reviewed and stable  Post vital signs: Reviewed and stable  Last Vitals:  Vitals Value Taken Time  BP 131/73 07/27/23 1352  Temp 36.8 C 07/27/23 1350  Pulse 84 07/27/23 1353  Resp 23 07/27/23 1353  SpO2 100 % 07/27/23 1353  Vitals shown include unfiled device data.  Last Pain:  Vitals:   07/27/23 0617  TempSrc: Oral  PainSc: 0-No pain         Complications: No notable events documented.

## 2023-07-27 NOTE — Anesthesia Procedure Notes (Signed)
 Procedure Name: Intubation Date/Time: 07/27/2023 7:35 AM  Performed by: Lily Lovings, CRNAPre-anesthesia Checklist: Patient identified, Patient being monitored, Timeout performed, Emergency Drugs available and Suction available Patient Re-evaluated:Patient Re-evaluated prior to induction Oxygen Delivery Method: Circle system utilized Preoxygenation: Pre-oxygenation with 100% oxygen Induction Type: IV induction Ventilation: Mask ventilation without difficulty Laryngoscope Size: 3 and McGrath Grade View: Grade I Tube type: Oral Tube size: 7.0 mm Number of attempts: 1 Airway Equipment and Method: Stylet Placement Confirmation: ETT inserted through vocal cords under direct vision, positive ETCO2 and breath sounds checked- equal and bilateral Secured at: 22 cm Tube secured with: Tape Dental Injury: Teeth and Oropharynx as per pre-operative assessment  Comments: Soft bite block

## 2023-07-27 NOTE — Anesthesia Preprocedure Evaluation (Signed)
 Anesthesia Evaluation  Patient identified by MRN, date of birth, ID band Patient awake    Reviewed: Allergy & Precautions, H&P , NPO status , Patient's Chart, lab work & pertinent test results, reviewed documented beta blocker date and time   History of Anesthesia Complications Negative for: history of anesthetic complications  Airway Mallampati: II  TM Distance: >3 FB Neck ROM: full    Dental  (+) Dental Advidsory Given, Missing, Teeth Intact, Chipped   Pulmonary neg pulmonary ROS   Pulmonary exam normal breath sounds clear to auscultation       Cardiovascular Exercise Tolerance: Good negative cardio ROS Normal cardiovascular exam Rhythm:regular Rate:Normal     Neuro/Psych negative neurological ROS  negative psych ROS   GI/Hepatic negative GI ROS, Neg liver ROS,,,  Endo/Other  neg diabetes  Class 3 obesity  Renal/GU negative Renal ROS  negative genitourinary   Musculoskeletal   Abdominal   Peds  Hematology negative hematology ROS (+)   Anesthesia Other Findings Past Medical History: No date: Abnormal uterine bleeding (AUB) No date: Anemia No date: Dysmenorrhea No date: Endometriosis No date: Ovarian cyst   Reproductive/Obstetrics negative OB ROS                             Anesthesia Physical Anesthesia Plan  ASA: 3  Anesthesia Plan: General   Post-op Pain Management:    Induction: Intravenous  PONV Risk Score and Plan: 3 and Ondansetron, Dexamethasone, Midazolam and Treatment may vary due to age or medical condition  Airway Management Planned: Oral ETT  Additional Equipment:   Intra-op Plan:   Post-operative Plan: Extubation in OR  Informed Consent: I have reviewed the patients History and Physical, chart, labs and discussed the procedure including the risks, benefits and alternatives for the proposed anesthesia with the patient or authorized representative who has  indicated his/her understanding and acceptance.     Dental Advisory Given  Plan Discussed with: Anesthesiologist, CRNA and Surgeon  Anesthesia Plan Comments:        Anesthesia Quick Evaluation

## 2023-07-27 NOTE — Discharge Instructions (Signed)
Discharge instructions after  robotically-assisted total laparoscopic hysterectomy   For the next three days, take ibuprofen and acetaminophen on a schedule, every 8 hours. You can take them together or you can intersperse them, and take one every four hours. I also gave you gabapentin for nighttime, to help you sleep and also to control pain. Take gabapentin medicines at night for at least the next 3 nights. You also have a narcotic, oxycodone, to take as needed if the above medicines don't help.  Postop constipation is a major cause of pain. Stay well hydrated, walk as you tolerate, and take over the counter senna as well as stool softeners if you need them.   Signs and Symptoms to Report Call our office at [phone] if you have any of the following.   Fever over 100.4 degrees or higher  Severe stomach pain not relieved with pain medications  Bright red bleeding that's heavier than a period that does not slow with rest  To go the bathroom a lot (frequency), you can't hold your urine (urgency), or it hurts when you empty your bladder (urinate)  Chest pain  Shortness of breath  Pain in the calves of your legs  Severe nausea and vomiting not relieved with anti-nausea medications  Signs of infection around your wounds, such as redness, hot to touch, swelling, green/yellow drainage (like pus), bad smelling discharge  Any concerns  What You Can Expect after Surgery  You may see some pink tinged, bloody fluid and bruising around the wound. This is normal.  You may notice shoulder and neck pain. This is caused by the gas used during surgery to expand your abdomen so your surgeon could get to the uterus easier.  You may have a sore throat because of the tube in your mouth during general anesthesia. This will go away in 2 to 3 days.  You may have some stomach cramps.  You may notice spotting on your panties.  You may have pain around the incision sites.   Activities after Your  Discharge Follow these guidelines to help speed your recovery at home:  Do the coughing and deep breathing as you did in the hospital for 2 weeks. Use the small blue breathing device, called the incentive spirometer for 2 weeks.  Don't drive if you are in pain or taking narcotic pain medicine. You may drive when you can safely slam on the brakes, turn the wheel forcefully, and rotate your torso comfortably. This is typically 1-2 weeks. Practice in a parking lot or side street prior to attempting to drive regularly.   Ask others to help with household chores for 4 weeks.  Do not lift anything heavier that 10 pounds for 4-6 weeks. This includes pets, children, and groceries.  Don't do strenuous activities, exercises, or sports like vacuuming, tennis, squash, etc. until your doctor says it is safe to do so. ---Maintain pelvic rest for 12 weeks. This means nothing in the vagina or rectum at all (no douching, tampons, intercourse) for 12 weeks.   Walk as you feel able. Rest often since it may take two or three weeks for your energy level to return to normal.   You may climb stairs  Avoid constipation:   -Eat fruits, vegetables, and whole grains. Eat small meals as your appetite will take time to return to normal.   -Drink 6 to 8 glasses of water each day unless your doctor has told you to limit your fluids.   -Use a laxative or  stool softener as needed if constipation becomes a problem. You may take Miralax, metamucil, Citrucil, Colace, Senekot, FiberCon, etc. If this does not relieve the constipation, try two tablespoons of Milk Of Magnesia every 8 hours until your bowels move.   You may shower. Gently wash the wounds with a mild soap and water. Pat dry.  Do not get in a hot tub, swimming pool, etc. for 6 weeks.  Do not use lotions, oils, powders on the wounds.  Do not douche, use tampons, or have sex until your doctor says it is okay.  Take your pain medicine when you need it. The medicine may not  work as well if the pain is bad.  Take the medicines you were taking before surgery. Other medications you will need are pain medications (Norco or Percocet) and nausea medications (Zofran).

## 2023-07-27 NOTE — Op Note (Addendum)
 Ashlee Curtis PROCEDURE DATE: 07/27/2023  PREOPERATIVE DIAGNOSIS: Dysmenorrhea and menometrorrhagia POSTOPERATIVE DIAGNOSIS: Endometriosis, significant pelvic adhesive disease PROCEDURE:  XI ROBOTIC ASSISTED LAPAROSCOPIC HYSTERECTOMY, EXCISION OF ENDOMETRIOSIS:  LYSIS, ADHESIONS, ROBOT-ASSISTED, LAPAROSCOPIC: 58660 (CPT) CYSTOSCOPY: 52000 (CPT) APPENDECTOMY, ROBOT-ASSISTED, LAPAROSCOPIC:  Left oophoropexy  SURGEON:  Dr. Christeen Douglas, MD ASSISTANT: Dr. Thomasene Mohair, MD  Intraoperative consult for general surgery: Dr. Campbell Lerner Anesthesiologist:  Anesthesiologist: Lenard Simmer, MD CRNA: Mohammed Kindle, CRNA; Lily Lovings, CRNA; Mathews Argyle, CRNA; Ginger Carne, CRNA  INDICATIONS: 43 y.o. F  here for definitive surgical management secondary to the indications listed under preoperative diagnoses; please see preoperative note for further details.  In brief, she has a history of known endometriosis, four low-transverse cesarean sections, bilateral tubal ligation, and right oophorectomy with endometriosis excision in the past.  She has failed medical management and asked for definitive surgical management  Risks of surgery were discussed with the patient including but not limited to: bleeding which may require transfusion or reoperation; infection which may require antibiotics; injury to bowel, bladder, ureters or other surrounding organs; need for additional procedures; thromboembolic phenomenon, incisional problems and other postoperative/anesthesia complications. Written informed consent was obtained.    FINDINGS:    External genitalia, vaginal canal and cervix negative for lesions. Intraoperative findings revealed a normal upper abdomen including bowel, liver, diaphragmatic surfaces, stomach, and omentum.   The uterus was enlarged and boggy. It was densely adherent to the anterior abdominal wall and it did progress laterally bilaterally, enough to  obscure the uterine vessels.  The right ovary was surgically absent.  The left ovary appeared normal.  However, after dissection, it did flop into the posterior pelvis, and as a deep dissection had been done along the left pelvic sidewall, I thought it prudent to reposition the ovary to the round ligament.  I am hoping to deter a retroperitoneal ovary development.  I was also concerned about postoperative pain if the ovary became adherent to the vaginal cuff, which is where it sat at the conclusion of the hysterectomy.  Bilateral tubes were absent.  Appendix with surface endometriosis implants.  The pelvic sigmoid also had endometriosis implants along its serosa these were left alone and will be managed medically.  Deep infiltrating endometriosis was noted in the left ovarian fossa to the left uterosacral ligament.  Superficial and deep endometriosis implants were noted in the posterior cul-de-sac.  The left ureter was dissected out to safely excise the deep infiltrating endometriosis implants on the left medial leaflet of the broad ligament.    ANESTHESIA:    General INTRAVENOUS FLUIDS: 1100  ml ESTIMATED BLOOD LOSS:75 ml URINE OUTPUT: 500 ml  SPECIMENS: Uterus, cervix, left ovarian fossa peritoneum, posterior fossa peritoneum with endometriosis. COMPLICATIONS: None immediate   RATLH/BS:   PROCEDURE IN DETAIL: After informed consent was obtained, the patient was taken to the operating room where general anesthesia was obtained without difficulty. The patient was positioned in the dorsal lithotomy position in [place] stirrups and her arms were carefully tucked at her sides and the usual precautions were taken. Deep Trendelenburg (20-25 deg) was established to confirm that she does not shift on the table.  She was prepped and draped in normal sterile fashion.  Time-out was performed and a Foley catheter was placed into the bladder. A standard VCare uterine manipulator was then placed in the  uterus without incident.  Preoperative prophylactic antibiotics were given through her iv.  She received Ancef at the beginning of the procedure and 4 hours  into it, followed by 1 g of Flagyl when appendectomy was decided upon.  After infiltration of local anesthetic at the proposed trocar sites, an 8 mm incision was created at the umbilicus, and a robotic 8 mm port was placed under direct visualization, after confirmation of OG tube working well. Pneumoperitoneum was created to a pressure of 15 mm Hg. The camera was placed and the abdomin surveyed, noting intact bowel below the site of entry. A survey of the pelvis and upper abdomen revealed the above findings. One right and one left lateral 8-mm robotic ports were placed under direct visualization.  After visualization of the scar tissue and the amount of bowel that continued invade the pelvis, a second left port was placed to allow for bowel retraction.  The patient was placed in deepTrendelenburg and the bowel was displaced up into the upper abdomen. The robot was left side docked. The instruments were placed under direct visualization.   The ureters were identified bilaterally coursing outside of the operative field. Round ligaments were divided on each side with the EndoShears and the retroperitoneal space was opened bilaterally. The posterior leaflet of the broad was taken down to the level of the IP ligament on the left side.  The ureter was dissected out, allowing for full dissection and peritoneal stripping on the side.  However, deep infiltrating endometriosis in this area required the ureter to be dissected out and displaced laterally.    The anterior leaflet of the broad ligament was carefully taken down to the midline.  A large portion of this case was lysis of adhesions being careful not to enter the bladder.  The bladder was backfilled with 250 mL of blue dyed sterile fluid to assist in this dissection.    A bladder flap was created and the  bladder was dissected down off the lower uterine segment and cervix using endoshears and electrocautery.    The portion of the remaining fallopian tubes were divided from the ovaries, and care taken to hemostatically transect the utero-ovarian ligament. The peritoneum was taken down to the level of the internal os, and the uterine arteries skeletonized. With strong cephalad pressure from the V-care, bipolar cautery was used to seal and transect the uterine arteries, and the pedicles allowed to fall away laterally.  Again, significant adhesions at the uterine arteries required the uterine arteries to be approached from the posterior cervix around towards the bladder, instead of the other way around.  A colpotomy was performed circumferentially along the V-Care ring with monopolar electrocautery and the cervix was incised from the vagina using the laparoscopic scissors. The specimen was removed through the vagina using the excite technique and a small Alexis retractor.    The decision to proceed with appendectomy was made at this time.  General surgery was kind enough to speak with the patient's family about the advisability of this case to complete as much endometriosis removal as possible.  Please see his operative note for this portion of the case.  An Endo Catch bag was placed vaginally to assume custody of the appendix.  This was removed through the vagina, and  pneumo balloon was placed in the vagina and the vaginal cuff was then closed in a running continuous fashion using the  0 V-Lock suture with careful attention to include the vaginal cuff angles, the uterosacral ligaments and the vaginal mucosa within the closure.  Hemostasis was secured with intraabdominal pressure and review of all surgical sites.  The left ovary was sewed  using delayed absorbable suture to the left stump of the left round ligament.  The intraperitoneal pressure was dropped, and all planes of dissection, vascular  pedicles and the vaginal cuff were found to be hemostatic.  The robot was undocked.   Attention was turned to the bladder and cystoscopy showed vigorous bilateral ureteral jets.  No stitches were visualized in the bladder during cystoscopy.  Of note, there was no endometriosis noted in the bladder that would account for her pelvic pain.  The lateral trocars were removed under visualization.  The CO2 gas was released and several deep breaths given to remove any remaining CO2 from the peritoneal cavity.  The skin incisions were closed with 4-0 Monocryl subcuticular stitch and Dermabond.    Anesthesia was reversed without difficulty.  The patient tolerated the procedure well.  Sponge, lap and needle counts were correct x2.  The patient was taken to recovery room in excellent condition.  It was decided that the patient would stay for 1 night in the hospital postoperatively to assist with recovery, given the extensiveness of her endometriosis and significant pelvic pain history.

## 2023-07-27 NOTE — OR Nursing (Signed)
 Both providers, Dr. Dalbert Garnet and Dr. Claudine Mouton, went to speak with family prior to performing robotic assisted laparoscopic appendectomy due to severity of endometriosis. Family agreeable to procedure per providers.

## 2023-07-28 DIAGNOSIS — N946 Dysmenorrhea, unspecified: Secondary | ICD-10-CM | POA: Diagnosis not present

## 2023-07-28 MED ORDER — ACETAMINOPHEN EXTRA STRENGTH 500 MG PO TABS: 1000.0000 mg | ORAL_TABLET | Freq: Four times a day (QID) | ORAL | 0 refills | Status: AC

## 2023-07-28 MED ORDER — CELECOXIB 200 MG PO CAPS: 200.0000 mg | ORAL_CAPSULE | Freq: Two times a day (BID) | ORAL | 0 refills | Status: AC

## 2023-07-28 MED ORDER — GABAPENTIN 300 MG PO CAPS: 300.0000 mg | ORAL_CAPSULE | Freq: Every day | ORAL | 0 refills | Status: DC

## 2023-07-28 MED ORDER — DOCUSATE SODIUM 100 MG PO CAPS: 100.0000 mg | ORAL_CAPSULE | Freq: Two times a day (BID) | ORAL | 0 refills | Status: DC

## 2023-07-28 MED ORDER — OXYCODONE HCL 5 MG PO TABS: 5.0000 mg | ORAL_TABLET | ORAL | 0 refills | Status: DC | PRN

## 2023-07-30 ENCOUNTER — Encounter: Payer: Self-pay | Admitting: Obstetrics and Gynecology

## 2023-07-30 LAB — SURGICAL PATHOLOGY

## 2023-08-05 ENCOUNTER — Emergency Department
Admission: EM | Admit: 2023-08-05 | Discharge: 2023-08-05 | Disposition: A | Discharge status: Home / Self Care | Attending: Emergency Medicine | Admitting: Emergency Medicine

## 2023-08-05 ENCOUNTER — Other Ambulatory Visit: Payer: Self-pay

## 2023-08-05 DIAGNOSIS — R21 Rash and other nonspecific skin eruption: Secondary | ICD-10-CM | POA: Diagnosis present

## 2023-08-05 DIAGNOSIS — R109 Unspecified abdominal pain: Secondary | ICD-10-CM | POA: Diagnosis not present

## 2023-08-05 LAB — CBC
HCT: 32.3 % — ABNORMAL LOW (ref 36.0–46.0)
Hemoglobin: 10.6 g/dL — ABNORMAL LOW (ref 12.0–15.0)
MCH: 26.8 pg (ref 26.0–34.0)
MCHC: 32.8 g/dL (ref 30.0–36.0)
MCV: 81.6 fL (ref 80.0–100.0)
Platelets: 236 10*3/uL (ref 150–400)
RBC: 3.96 MIL/uL (ref 3.87–5.11)
RDW: 13 % (ref 11.5–15.5)
WBC: 5.8 10*3/uL (ref 4.0–10.5)
nRBC: 0 % (ref 0.0–0.2)

## 2023-08-05 LAB — BASIC METABOLIC PANEL
Anion gap: 11 (ref 5–15)
BUN: 12 mg/dL (ref 6–20)
CO2: 26 mmol/L (ref 22–32)
Calcium: 8.8 mg/dL — ABNORMAL LOW (ref 8.9–10.3)
Chloride: 101 mmol/L (ref 98–111)
Creatinine, Ser: 0.92 mg/dL (ref 0.44–1.00)
GFR, Estimated: >60 mL/min (ref >60)
Glucose, Bld: 126 mg/dL — ABNORMAL HIGH (ref 70–99)
Potassium: 3.7 mmol/L (ref 3.5–5.1)
Sodium: 138 mmol/L (ref 135–145)

## 2023-08-05 MED ORDER — PREDNISONE 20 MG PO TABS
60.0000 mg | ORAL_TABLET | Freq: Once | ORAL | Status: AC
  Administered 2023-08-05: 60 mg via ORAL
  Filled 2023-08-05: qty 3

## 2023-08-05 MED ORDER — DIPHENHYDRAMINE HCL 25 MG PO CAPS
50.0000 mg | ORAL_CAPSULE | Freq: Once | ORAL | Status: AC
  Administered 2023-08-05: 50 mg via ORAL
  Filled 2023-08-05: qty 2

## 2023-08-05 MED ORDER — PREDNISONE 10 MG PO TABS: 40.0000 mg | ORAL_TABLET | Freq: Every day | ORAL | 0 refills | Status: AC

## 2023-08-05 MED ORDER — CEPHALEXIN 500 MG PO CAPS: 500.0000 mg | ORAL_CAPSULE | Freq: Three times a day (TID) | ORAL | 0 refills | Status: AC

## 2023-08-05 MED ORDER — CEPHALEXIN 500 MG PO CAPS
500.0000 mg | ORAL_CAPSULE | Freq: Once | ORAL | Status: AC
  Administered 2023-08-05: 500 mg via ORAL
  Filled 2023-08-05: qty 1

## 2023-08-05 NOTE — ED Triage Notes (Signed)
 Pt sts that she had a hyst 14 days ago and since then pt sts that she has redness along the side of her staples and they itch really abd.

## 2023-08-05 NOTE — Discharge Instructions (Signed)
 I have sent steroids and antibiotics to your pharmacy to hopefully help treat symptoms.  You can also take Benadryl 25 to 50 mg by mouth every 6-8 hours as needed.  You can also pick up hydrocortisone topical cream to apply over the areas as needed.  Please reach out to your gynecology team for follow-up visit.

## 2023-08-05 NOTE — ED Provider Notes (Signed)
 [facility] Provider Note    Event Date/Time   First MD Initiated Contact with Patient 08/05/23 1928     (approximate)   History   Abdominal Pain   HPI Ashlee Curtis is a 43 y.o. female presenting today for rash.  Patient states she recently underwent laparoscopic hysterectomy.  She has no abdominal pain but woke up today with redness around the site of her laparoscopic entry points.  She has felt very itchy in these areas and in her abdomen.  She specifically states no abdominal pain, nausea, or vomiting.  Otherwise been feeling well since the procedure.  No recent new foods, medications, detergents, or cleaning supplies.     Physical Exam   Triage Vital Signs: ED Triage Vitals [08/05/23 1819]  Encounter Vitals Group     BP (!) 154/80     Systolic BP Percentile      Diastolic BP Percentile      Pulse Rate 84     Resp 17     Temp 98.5 F (36.9 C)     Temp Source Oral     SpO2 100 %     Weight 240 lb 4.8 oz (109 kg)     Height 5\' 3"  (1.6 m)     Head Circumference      Peak Flow      Pain Score 6     Pain Loc      Pain Education      Exclude from Growth Chart     Most recent vital signs: Vitals:   08/05/23 1819  BP: (!) 154/80  Pulse: 84  Resp: 17  Temp: 98.5 F (36.9 C)  SpO2: 100%   I have reviewed the vital signs. General:  Awake, alert, no acute distress. Head:  Normocephalic, Atraumatic. EENT:  PERRL, EOMI, Oral mucosa pink and moist, Neck is supple. Cardiovascular: Regular rate, 2+ distal pulses. Respiratory:  Normal respiratory effort, symmetrical expansion, no distress.   Abdomen: No abdominal tenderness anywhere throughout the abdomen.  Soft, nondistended. Extremities:  Moving all four extremities through full ROM without pain.   Neuro:  Alert and oriented.  Interacting appropriately.   Skin: Erythematous rash around each entry point of laparoscopic procedure.  Also slight hives noted to the flanks of the bilateral abdomen.   No hives elsewhere. Psych: Appropriate affect.    ED Results / Procedures / Treatments   Labs (all labs ordered are listed, but only abnormal results are displayed) Labs Reviewed  CBC - Abnormal; Notable for the following components:      Result Value   Hemoglobin 10.6 (*)    HCT 32.3 (*)    All other components within normal limits  BASIC METABOLIC PANEL - Abnormal; Notable for the following components:   Glucose, Bld 126 (*)    Calcium 8.8 (*)    All other components within normal limits     EKG    RADIOLOGY    PROCEDURES:  Critical Care performed: No  Procedures   MEDICATIONS ORDERED IN ED: Medications  cephALEXin (KEFLEX) capsule 500 mg (has no administration in time range)  diphenhydrAMINE (BENADRYL) capsule 50 mg (50 mg Oral Given 08/05/23 1957)  predniSONE (DELTASONE) tablet 60 mg (60 mg Oral Given 08/05/23 1957)     IMPRESSION / MDM / ASSESSMENT AND PLAN / ED COURSE  I reviewed the triage vital signs and the nursing notes.  Differential diagnosis includes, but is not limited to, allergic reaction, cellulitis  Patient's presentation is most consistent with acute complicated illness / injury requiring diagnostic workup.  Patient is a 43 year old female presenting today for rash and itchiness around the site of where her laparoscopic surgery was performed.  There is erythema with itchiness and hives surrounding each entry point.  Her labs are stable and vital signs unremarkable.  She has no abdominal tenderness anywhere to suggest intra-abdominal infection.  Most concern for either possible allergic reaction around the sites of her surgery versus developing cellulitis.  Will give Benadryl and prednisone here initially for symptom control.  Patient noted improvement following Benadryl and prednisone.  Rash still present though around the laparoscopic sites.  May be combination of allergic reaction and cellulitis.  Will send out on  Keflex and prednisone and take Benadryl as needed.  Told to call her gynecology team for follow-up.  Patient agreeable with plan and given strict return precautions.  The patient is on the cardiac monitor to evaluate for evidence of arrhythmia and/or significant heart rate changes. Clinical Course as of 08/05/23 2209  Sun Aug 05, 2023  2200 Patient feeling improved.  Will discharge at this time [DW]    Clinical Course User Index [DW] Janith Lima, MD     FINAL CLINICAL IMPRESSION(S) / ED DIAGNOSES   Final diagnoses:  Rash     Rx / DC Orders   ED Discharge Orders          Ordered    predniSONE (DELTASONE) 10 MG tablet  Daily        08/05/23 2209    cephALEXin (KEFLEX) 500 MG capsule  3 times daily        08/05/23 2209             Note:  This document was prepared using Dragon voice recognition software and may include unintentional dictation errors.   Janith Lima, MD 08/05/23 [phone]

## 2023-08-13 NOTE — Anesthesia Postprocedure Evaluation (Signed)
 Anesthesia Post Note  Patient: Ashlee Curtis  Procedure(s) Performed: XI ROBOTIC ASSISTED LAPAROSCOPIC HYSTERECTOMY, EXCISION OF ENDOMETRIOSIS (Bilateral: Abdomen) LYSIS, ADHESIONS, ROBOT-ASSISTED, LAPAROSCOPIC (Abdomen) CYSTOSCOPY APPENDECTOMY, ROBOT-ASSISTED, LAPAROSCOPIC (Abdomen)  Patient location during evaluation: PACU Anesthesia Type: General Level of consciousness: awake and alert Pain management: pain level controlled Vital Signs Assessment: post-procedure vital signs reviewed and stable Respiratory status: spontaneous breathing, nonlabored ventilation, respiratory function stable and patient connected to nasal cannula oxygen Cardiovascular status: blood pressure returned to baseline and stable Postop Assessment: no apparent nausea or vomiting Anesthetic complications: no   No notable events documented.   Last Vitals:  Vitals:   07/28/23 0800 07/28/23 1100  BP: 133/82 127/64  Pulse: 76 75  Resp: 16 18  Temp: (!) 36.4 C 36.7 C  SpO2: 99%     Last Pain:  Vitals:   07/28/23 1219  TempSrc:   PainSc: 6                  Lenard Simmer

## 2023-11-09 ENCOUNTER — Emergency Department
Admission: EM | Admit: 2023-11-09 | Discharge: 2023-11-09 | Disposition: A | Discharge status: Home / Self Care | Attending: Emergency Medicine | Admitting: Emergency Medicine

## 2023-11-09 ENCOUNTER — Emergency Department

## 2023-11-09 ENCOUNTER — Other Ambulatory Visit: Payer: Self-pay

## 2023-11-09 DIAGNOSIS — R0789 Other chest pain: Secondary | ICD-10-CM | POA: Diagnosis not present

## 2023-11-09 DIAGNOSIS — K8020 Calculus of gallbladder without cholecystitis without obstruction: Secondary | ICD-10-CM | POA: Insufficient documentation

## 2023-11-09 DIAGNOSIS — K8050 Calculus of bile duct without cholangitis or cholecystitis without obstruction: Secondary | ICD-10-CM

## 2023-11-09 DIAGNOSIS — R079 Chest pain, unspecified: Secondary | ICD-10-CM | POA: Diagnosis present

## 2023-11-09 LAB — CBC
HCT: 39 % (ref 36.0–46.0)
Hemoglobin: 13 g/dL (ref 12.0–15.0)
MCH: 27.8 pg (ref 26.0–34.0)
MCHC: 33.3 g/dL (ref 30.0–36.0)
MCV: 83.3 fL (ref 80.0–100.0)
Platelets: 231 10*3/uL (ref 150–400)
RBC: 4.68 MIL/uL (ref 3.87–5.11)
RDW: 13.9 % (ref 11.5–15.5)
WBC: 5.5 10*3/uL (ref 4.0–10.5)
nRBC: 0 % (ref 0.0–0.2)

## 2023-11-09 LAB — COMPREHENSIVE METABOLIC PANEL WITH GFR
ALT: 14 U/L (ref 0–44)
AST: 14 U/L — ABNORMAL LOW (ref 15–41)
Albumin: 3.7 g/dL (ref 3.5–5.0)
Alkaline Phosphatase: 47 U/L (ref 38–126)
Anion gap: 7 (ref 5–15)
BUN: 15 mg/dL (ref 6–20)
CO2: 26 mmol/L (ref 22–32)
Calcium: 9 mg/dL (ref 8.9–10.3)
Chloride: 106 mmol/L (ref 98–111)
Creatinine, Ser: 0.96 mg/dL (ref 0.44–1.00)
GFR, Estimated: >60 mL/min (ref >60)
Glucose, Bld: 122 mg/dL — ABNORMAL HIGH (ref 70–99)
Potassium: 3.9 mmol/L (ref 3.5–5.1)
Sodium: 139 mmol/L (ref 135–145)
Total Bilirubin: 0.5 mg/dL (ref 0.0–1.2)
Total Protein: 6.4 g/dL — ABNORMAL LOW (ref 6.5–8.1)

## 2023-11-09 LAB — TROPONIN I (HIGH SENSITIVITY)
Troponin I (High Sensitivity): <2 ng/L (ref <18)
Troponin I (High Sensitivity): 3 ng/L (ref <18)

## 2023-11-09 LAB — D-DIMER, QUANTITATIVE: D-Dimer, Quant: 0.36 ug{FEU}/mL (ref 0.00–0.50)

## 2023-11-09 LAB — LIPASE, BLOOD: Lipase: 37 U/L (ref 11–51)

## 2023-11-09 MED ORDER — ONDANSETRON 4 MG PO TBDP: ORAL_TABLET | ORAL | 0 refills | Status: DC

## 2023-11-09 MED ORDER — ONDANSETRON HCL 4 MG/2ML IJ SOLN
4.0000 mg | INTRAMUSCULAR | Status: AC
  Administered 2023-11-09: 4 mg via INTRAVENOUS
  Filled 2023-11-09: qty 2

## 2023-11-09 MED ORDER — HYDROCODONE-ACETAMINOPHEN 5-325 MG PO TABS: 2.0000 | ORAL_TABLET | Freq: Four times a day (QID) | ORAL | 0 refills | Status: DC | PRN

## 2023-11-09 MED ORDER — KETOROLAC TROMETHAMINE 30 MG/ML IJ SOLN
15.0000 mg | Freq: Once | INTRAMUSCULAR | Status: AC
  Administered 2023-11-09: 15 mg via INTRAVENOUS
  Filled 2023-11-09: qty 1

## 2023-11-09 MED ORDER — MORPHINE SULFATE (PF) 4 MG/ML IV SOLN
4.0000 mg | Freq: Once | INTRAVENOUS | Status: AC
  Administered 2023-11-09: 4 mg via INTRAVENOUS
  Filled 2023-11-09: qty 1

## 2023-11-09 MED ORDER — DICYCLOMINE HCL 10 MG PO CAPS: 10.0000 mg | ORAL_CAPSULE | Freq: Three times a day (TID) | ORAL | 0 refills | Status: DC | PRN

## 2023-11-09 NOTE — ED Triage Notes (Signed)
 Pt reports she woke up this morning with chest pain back pain and right shoulder pain. Pt denies cardiac hx. Pt endorses nausea and states she feels like she can't take a deep breath.

## 2023-11-09 NOTE — ED Provider Notes (Signed)
 [facility] Provider Note    Event Date/Time   First MD Initiated Contact with Patient 11/09/23 [phone]     (approximate)   History   Chest Pain   HPI Ashlee Curtis is a 43 y.o. female who reports pain in her chest and back and right shoulder and upper abdomen.  She said that it started when she woke up nearly 24 hours ago and has been persistent all day.  She has been nauseated but not vomiting.  She said that it hurts worse when she takes deep breaths and it feels like it is underneath her ribs as well as radiating up into her chest and through to her back.  She cannot find a comfortable position.  No history of blood clots in the legs of the lungs.  She has had tubal ligation, hysterectomy and salpingectomy, and she still has her left ovary but not the right.  She also had an appendectomy but has not had a cholecystectomy.     Physical Exam   Triage Vital Signs: ED Triage Vitals  Encounter Vitals Group     BP 11/09/23 0220 (!) 172/98     Girls Systolic BP Percentile --      Girls Diastolic BP Percentile --      Boys Systolic BP Percentile --      Boys Diastolic BP Percentile --      Pulse Rate 11/09/23 0220 76     Resp 11/09/23 0220 20     Temp 11/09/23 0220 98.4 F (36.9 C)     Temp Source 11/09/23 0220 Oral     SpO2 11/09/23 0220 100 %     Weight 11/09/23 0216 105.2 kg (232 lb)     Height 11/09/23 0216 1.6 m (5' 3)     Head Circumference --      Peak Flow --      Pain Score 11/09/23 0216 7     Pain Loc --      Pain Education --      Exclude from Growth Chart --     Most recent vital signs: Vitals:   11/09/23 0220  BP: (!) 172/98  Pulse: 76  Resp: 20  Temp: 98.4 F (36.9 C)  SpO2: 100%    General: Awake, appears uncomfortable but nontoxic. CV:  Good peripheral perfusion.  Regular rate and rhythm.   Resp:  Normal effort. Speaking easily and comfortably, no accessory muscle usage nor intercostal retractions.  Obese.  Soft and  nondistended.  Tender to palpation in the epigastrium and right upper quadrant with equivocal Murphy sign.  No lower abdominal tenderness. Abd:   See ED course for details  ED Results / Procedures / Treatments   Labs (all labs ordered are listed, but only abnormal results are displayed) Labs Reviewed  COMPREHENSIVE METABOLIC PANEL WITH GFR - Abnormal; Notable for the following components:      Result Value   Glucose, Bld 122 (*)    Total Protein 6.4 (*)    AST 14 (*)    All other components within normal limits  CBC  LIPASE, BLOOD  D-DIMER, QUANTITATIVE  TROPONIN I (HIGH SENSITIVITY)  TROPONIN I (HIGH SENSITIVITY)     EKG  ED ECG REPORT I, Darleene Dome, the attending physician, personally viewed and interpreted this ECG.  Date: 11/09/2023 EKG Time: 2:18 AM Rate: 72 Rhythm: normal sinus rhythm QRS Axis: normal Intervals: normal ST/T Wave abnormalities: normal Narrative Interpretation: no evidence of acute ischemia  RADIOLOGY See ED Course for details.   PROCEDURES:  Critical Care performed: No  .1-3 Lead EKG Interpretation  Performed by: Gordan Huxley, MD Authorized by: Gordan Huxley, MD     Interpretation: normal     ECG rate:  75   ECG rate assessment: normal     Rhythm: sinus rhythm     Ectopy: none     Conduction: normal       IMPRESSION / MDM / ASSESSMENT AND PLAN / ED COURSE  I reviewed the triage vital signs and the nursing notes.                              Differential diagnosis includes, but is not limited to, biliary colic/gallbladder disease, pancreatitis, ACS, PE.  Patient's presentation is most consistent with acute presentation with potential threat to life or bodily function.  Labs/studies ordered: CMP, lipase, CBC, D-dimer, high-sensitivity troponin x 2, EKG, right upper quadrant ultrasound  Interventions/Medications given:  Medications  morphine (PF) 4 MG/ML injection 4 mg (4 mg Intravenous Given 11/09/23 0348)   ondansetron  (ZOFRAN ) injection 4 mg (4 mg Intravenous Given 11/09/23 0347)  ketorolac  (TORADOL ) 30 MG/ML injection 15 mg (15 mg Intravenous Given 11/09/23 0347)    (Note:  hospital course my include additional interventions and/or labs/studies not listed above.)   Based on the physical exam and the history as well as the patient's demographics, I suspect the patient's atypical chest pain is the result of biliary colic.  Her labs are all essentially normal including her initial high-sensitivity troponin.  We will get a second 1.  I also ordered a D-dimer which fortunately is within normal limits, effectively excluding PE as a possibility.  The lipase is normal which also exclude pancreatitis.  I ordered medications as listed above including IV morphine and IV Toradol  for pain relief and we will proceed with an ultrasound for evaluation of the gallbladder  The patient is on the cardiac monitor to evaluate for evidence of arrhythmia and/or significant heart rate changes.   Clinical Course as of 11/09/23 0536  Fri Nov 09, 2023  0432 Troponin I (High Sensitivity): 3 Second troponin is negative [CF]  0448 US  ABDOMEN LIMITED RUQ (LIVER/GB) I independently viewed and interpreted the patient's ultrasound and I could see multiple gallstones.  However I see no pericholecystic fluid or gallbladder wall thickening.  Radiology confirms multiple large stones, but no evidence of cholecystitis. [CF]  0533 Reassessed patient.  She is feeling much better and her symptoms are gone.  I had my usual and customary gallstones/biliary colic discussion with her.  She agrees with the plan to follow-up as an outpatient.  I gave my usual and customary return precautions. [CF]    Clinical Course User Index [CF] Gordan Huxley, MD     FINAL CLINICAL IMPRESSION(S) / ED DIAGNOSES   Final diagnoses:  Biliary colic  Gallstones  Atypical chest pain     Rx / DC Orders   ED Discharge Orders          Ordered     HYDROcodone -acetaminophen  (NORCO/VICODIN) 5-325 MG tablet  Every 6 hours PRN        11/09/23 0535    ondansetron  (ZOFRAN -ODT) 4 MG disintegrating tablet        11/09/23 0535    dicyclomine (BENTYL) 10 MG capsule  3 times daily PRN        11/09/23 0535  Note:  This document was prepared using Dragon voice recognition software and may include unintentional dictation errors.   Gordan Huxley, MD 11/09/23 [phone]

## 2023-11-09 NOTE — ED Notes (Signed)
 [place] bedside

## 2023-11-09 NOTE — ED Notes (Signed)
 MD made aware pt c/o seeing flies/ dark spots. Pt also states pain is sharper in back.

## 2023-11-09 NOTE — ED Notes (Signed)
 Urine sent down with save tube label

## 2023-11-09 NOTE — Discharge Instructions (Signed)
You have been seen in the Emergency Department (ED) for abdominal pain.  Your evaluation suggests that your pain is caused by gallstones.  Fortunately you do not need immediate surgery at this time, but it is important that you follow up with a surgeon as an outpatient; typically surgical removal of the gallbladder is the only thing that will definitively fix your issue.  Read through the included information about a bland diet, and use any prescribed medications as instructed.  Avoid smoking and alcohol use. ? ?Please follow up as instructed above regarding today?s emergent visit and the symptoms that are bothering you. ? ?Take Norco as prescribed. Do not drink alcohol, drive or participate in any other potentially dangerous activities while taking this medication as it may make you sleepy. Do not take this medication with any other sedating medications, either prescription or over-the-counter. If you were prescribed Percocet or Vicodin, do not take these with acetaminophen (Tylenol) as it is already contained within these medications. ?  ?This medication is an opiate (or narcotic) pain medication and can be habit forming.  Use it as little as possible to achieve adequate pain control.  Do not use or use it with extreme caution if you have a history of opiate abuse or dependence.  If you are on a pain contract with your primary care doctor or a pain specialist, be sure to let them know you were prescribed this medication today from the [facility].  This medication is intended for your use only - do not give any to anyone else and keep it in a secure place where nobody else, especially children, have access to it.  It will also cause or worsen constipation, so you may want to consider taking an over-the-counter stool softener while you are taking this medication. ? ?Return to the ED if your abdominal pain worsens or fails to improve, you develop bloody vomiting, bloody diarrhea, you are  unable to tolerate fluids due to vomiting, fever greater than 101, or other symptoms that concern you. ? ?

## 2023-11-13 DIAGNOSIS — K8020 Calculus of gallbladder without cholecystitis without obstruction: Secondary | ICD-10-CM

## 2023-11-13 HISTORY — DX: Calculus of gallbladder without cholecystitis without obstruction: K80.20

## 2023-11-19 ENCOUNTER — Ambulatory Visit (INDEPENDENT_AMBULATORY_CARE_PROVIDER_SITE_OTHER): Admitting: Surgery

## 2023-11-19 ENCOUNTER — Encounter: Payer: Self-pay | Admitting: Surgery

## 2023-11-19 ENCOUNTER — Telehealth: Payer: Self-pay | Admitting: Surgery

## 2023-11-19 VITALS — BP 135/85 | HR 74 | Temp 98.5°F | Ht 63.0 in | Wt 244.0 lb

## 2023-11-19 DIAGNOSIS — K8020 Calculus of gallbladder without cholecystitis without obstruction: Secondary | ICD-10-CM

## 2023-11-19 NOTE — H&P (View-Only) (Signed)
 11/19/2023  Reason for Visit:  Symptomatic cholelithiasis  History of Present Illness: Ashlee Curtis is a 43 y.o. female presenting for evaluation of symptomatic cholelithiasis.  The patient presented to the emergency room on 11/09/2023 with complaints of chest pain, back pain between her shoulder blades, and epigastric and right upper quadrant pain that started earlier the night before.  She had [employer] for dinner.  The patient reported having some nausea but denies any emesis.  In the emergency room, initial cardiac workup was negative.  She also had an ultrasound of the right upper quadrant which showed multiple gallstones measuring up to 1 cm in size but without any inflammatory changes and a normal common bile duct size.  Her LFTs were also normal except for AST was low at 14.  White blood cell, is also normal at 5.5.  Her symptoms resolved emergency room and she was able to get discharged home.  She has kept a low fat diet since.  The patient reports that now knowing more of the symptoms and looking more into what the gallbladder does not however can cause issues, she feels that over the past 2 years she has had episodes of indigestion bloatedness and upper abdominal discomfort which she is wondering could be related to her gallbladder.  Past Medical History: Past Medical History:  Diagnosis Date   Abnormal uterine bleeding (AUB)    Anemia    Dysmenorrhea    Endometriosis    Ovarian cyst      Past Surgical History: Past Surgical History:  Procedure Laterality Date   CESAREAN SECTION     4 cesareans   COLONOSCOPY WITH PROPOFOL  N/A 06/30/2022   Procedure: COLONOSCOPY WITH PROPOFOL ;  Surgeon: Maryruth Ole DASEN, MD;  Location: [facility];  Service: Endoscopy;  Laterality: N/A;   CYSTOSCOPY N/A 07/27/2023   Procedure: CYSTOSCOPY;  Surgeon: Verdon Keen, MD;  Location: [facility];  Service: Gynecology;  Laterality: N/A;   LAPAROSCOPIC UNILATERAL SALPINGECTOMY Left 09/06/2021    RIGHT OVARY & LEFT TUBE REMOVED Procedure: LAPAROSCOPIC UNILATERAL SALPINGECTOMY;  Surgeon: Janit Alm Agent, MD;  Location: [facility];  Service: Gynecology;  Laterality: Left;   LAPAROSCOPY N/A 09/06/2021   Procedure: LAPAROSCOPY DIAGNOSTIC;  Surgeon: Janit Alm Agent, MD;  Location: [facility];  Service: Gynecology;  Laterality: N/A;   NASAL TURBINATE REDUCTION     ROBOTIC ASSISTED LAPAROSCOPIC HYSTERECTOMY AND SALPINGECTOMY Bilateral 07/27/2023   Procedure: XI ROBOTIC ASSISTED LAPAROSCOPIC HYSTERECTOMY, EXCISION OF ENDOMETRIOSIS;  Surgeon: Verdon Keen, MD;  Location: [facility];  Service: Gynecology;  Laterality: Bilateral;   ROBOTIC ASSISTED LAPAROSCOPIC LYSIS OF ADHESION N/A 07/27/2023   Procedure: LYSIS, ADHESIONS, ROBOT-ASSISTED, LAPAROSCOPIC;  Surgeon: Verdon Keen, MD;  Location: [facility];  Service: Gynecology;  Laterality: N/A;   TONSILLECTOMY AND ADENOIDECTOMY     TUBAL LIGATION  03/2009   XI ROBOTIC LAPAROSCOPIC ASSISTED APPENDECTOMY N/A 07/27/2023   Procedure: APPENDECTOMY, ROBOT-ASSISTED, LAPAROSCOPIC;  Surgeon: Verdon Keen, MD;  Location: [facility];  Service: Gynecology;  Laterality: N/A;    Home Medications: Prior to Admission medications   Medication Sig Start Date End Date Taking? Authorizing Provider  Multiple Vitamin (MULTIVITAMIN) capsule Take 1 capsule by mouth daily.    [provider]  ondansetron  (ZOFRAN -ODT) 4 MG disintegrating tablet Allow 1-2 tablets to dissolve in your mouth every 8 hours as needed for nausea/vomiting 11/09/23   Gordan Huxley, MD  oxyCODONE  (OXY IR/ROXICODONE ) 5 MG immediate release tablet Take 1 tablet (5 mg total) by mouth every 4 (four) hours as needed for  severe pain (pain score 7-10). 07/28/23   Verdon Keen, MD    Allergies: Allergies  Allergen Reactions   Sulfa Antibiotics Hives    Social History:  reports that she has never smoked. She has never used smokeless tobacco. She reports that she does not currently use  alcohol. She reports that she does not use drugs.   Family History: History reviewed. No pertinent family history.  Review of Systems: Review of Systems  Constitutional:  Negative for chills and fever.  HENT:  Negative for hearing loss.   Respiratory:  Negative for shortness of breath.   Cardiovascular:  Negative for chest pain.  Gastrointestinal:  Positive for abdominal pain and nausea. Negative for constipation and vomiting.  Genitourinary:  Negative for dysuria.  Musculoskeletal:  Positive for back pain.  Skin:  Negative for rash.  Neurological:  Negative for dizziness.  Psychiatric/Behavioral:  Negative for depression.     Physical Exam BP 135/85   Pulse 74   Temp 98.5 F (36.9 C) (Oral)   Ht 5' 3 (1.6 m)   Wt 244 lb (110.7 kg)   LMP 07/15/2023 (Exact Date)   SpO2 99%   BMI 43.22 kg/m  CONSTITUTIONAL: No acute distress HEENT:  Normocephalic, atraumatic, extraocular motion intact. NECK: Trachea is midline, and there is no jugular venous distension.  RESPIRATORY:  Lungs are clear, and breath sounds are equal bilaterally. Normal respiratory effort without pathologic use of accessory muscles. CARDIOVASCULAR: Heart is regular without murmurs, gallops, or rubs. GI: The abdomen is soft, non-distended, with some residual soreness to palpation in the RUQ, but a negative Murphy's sign.  Prior robotic port incisions are well healed.  MUSCULOSKELETAL:  Normal muscle strength and tone in all four extremities.  No peripheral edema or cyanosis. SKIN: Skin turgor is normal. There are no pathologic skin lesions.  NEUROLOGIC:  Motor and sensation is grossly normal.  Cranial nerves are grossly intact. PSYCH:  Alert and oriented to person, place and time. Affect is normal.  Laboratory Analysis: Labs from 11/09/2023: Sodium 139, potassium 3.9, chloride 106, CO2 26, BUN 15, creatinine 0.96.  Total bilirubin 0.5, AST 14, ALT 14, alkaline phosphatase 47, albumin 3.7, lipase 37.  WBC 5.5,  hemoglobin 13, hematocrit 39, platelets 231.  Imaging: Ultrasound RUQ on 11/09/2023: IMPRESSION: 1. Multiple mobile shadowing gallstones, largest measuring 10 mm. 2. No sonographic Murphy sign. Gallbladder wall thickness within normal limits.  Assessment and Plan: This is a 43 y.o. female with symptomatic cholelithiasis.  - Discussed with patient the findings on her ultrasound and laboratory workup in the emergency room.  Overall it does appear that she came in with an episode of biliary colic and her symptoms improved and resolved in the emergency room.  Ultrasound did not show inflammatory changes and her laboratory workup was normal as well.  As such, she was able to be discharged home.  Discussed with the patient the potential treatment options ranging from conservative measures, to medications, to surgical management.  Discussed with her the potential benefits and issues with each option and after further discussion, she has opted for proceeding with surgery. - Discussed with patient and the plan for a robotic assisted cholecystectomy and reviewed the surgery at length with her including the planned incisions, risks of bleeding, infection, injury to surrounding structures, the use of ICG to better evaluate the biliary anatomy, that this would be an outpatient procedure, postoperative activity restrictions, pain control, and she is willing to proceed. - We will tentatively schedule her for surgery  this week on 11/22/2023.  If this date does not work, we can schedule her for next week.  All of her questions have been answered.  I spent 60 minutes dedicated to the care of this patient on the date of this encounter to include pre-visit review of records, face-to-face time with the patient discussing diagnosis and management, and any post-visit coordination of care.   Aloysius Sheree Plant, MD [facility]

## 2023-11-19 NOTE — Patient Instructions (Signed)
 You have requested to have your gallbladder removed. This will be done at [facility] with Dr. Aleen Campi.  If you are on any injectable weight loss medication, you will need to stop taking your GLP-1 injectable (weight loss) medications 8 days before your surgery to avoid any complications with anesthesia.   You will most likely be out of work 1-2 weeks for this surgery.  If you have FMLA or disability paperwork that needs filled out you may drop this off at our office or this can be faxed to [phone].  You will return after your post-op appointment with a lifting restriction for approximately 4 more weeks.  You will be able to eat anything you would like to following surgery. But, start by eating a bland diet and advance this as tolerated. The Gallbladder diet is below, please go as closely by this diet as possible prior to surgery to avoid any further attacks.  Please see the (blue)pre-care form that you have been given today. Our surgery scheduler will call you to verify surgery date and to go over information.   If you have any questions, please call our office.  Laparoscopic Cholecystectomy Laparoscopic cholecystectomy is surgery to remove the gallbladder. The gallbladder is located in the upper right part of the abdomen, behind the liver. It is a storage sac for bile, which is produced in the liver. Bile aids in the digestion and absorption of fats. Cholecystectomy is often done for inflammation of the gallbladder (cholecystitis). This condition is usually caused by a buildup of gallstones (cholelithiasis) in the gallbladder. Gallstones can block the flow of bile, and that can result in inflammation and pain. In severe cases, emergency surgery may be required. If emergency surgery is not required, you will have time to prepare for the procedure. Laparoscopic surgery is an alternative to open surgery. Laparoscopic surgery has a shorter recovery time. Your common bile duct may also  need to be examined during the procedure. If stones are found in the common bile duct, they may be removed. LET [facility] CARE PROVIDER KNOW ABOUT: Any allergies you have. All medicines you are taking, including vitamins, herbs, eye drops, creams, and over-the-counter medicines. Previous problems you or members of your family have had with the use of anesthetics. Any blood disorders you have. Previous surgeries you have had.  Any medical conditions you have. RISKS AND COMPLICATIONS Generally, this is a safe procedure. However, problems may occur, including: Infection. Bleeding. Allergic reactions to medicines. Damage to other structures or organs. A stone remaining in the common bile duct. A bile leak from the cyst duct that is clipped when your gallbladder is removed. The need to convert to open surgery, which requires a larger incision in the abdomen. This may be necessary if your surgeon thinks that it is not safe to continue with a laparoscopic procedure. BEFORE THE PROCEDURE Ask your health care provider about: Changing or stopping your regular medicines. This is especially important if you are taking diabetes medicines or blood thinners. Taking medicines such as aspirin and ibuprofen. These medicines can thin your blood. Do not take these medicines before your procedure if your health care provider instructs you not to. Follow instructions from your health care provider about eating or drinking restrictions. Let your health care provider know if you develop a cold or an infection before surgery. Plan to have someone take you home after the procedure. Ask your health care provider how your surgical site will be marked or identified. You may  be given antibiotic medicine to help prevent infection. PROCEDURE To reduce your risk of infection: Your health care team will wash or sanitize their hands. Your skin will be washed with soap. An IV tube may be inserted into one of your  veins. You will be given a medicine to make you fall asleep (general anesthetic). A breathing tube will be placed in your mouth. The surgeon will make several small cuts (incisions) in your abdomen. A thin, lighted tube (laparoscope) that has a tiny camera on the end will be inserted through one of the small incisions. The camera on the laparoscope will send a picture to a TV screen (monitor) in the operating room. This will give the surgeon a good view inside your abdomen. A gas will be pumped into your abdomen. This will expand your abdomen to give the surgeon more room to perform the surgery. Other tools that are needed for the procedure will be inserted through the other incisions. The gallbladder will be removed through one of the incisions. After your gallbladder has been removed, the incisions will be closed with stitches (sutures), staples, or skin glue. Your incisions may be covered with a bandage (dressing). The procedure may vary among health care providers and hospitals. AFTER THE PROCEDURE Your blood pressure, heart rate, breathing rate, and blood oxygen level will be monitored often until the medicines you were given have worn off. You will be given medicines as needed to control your pain.   This information is not intended to replace advice given to you by your health care provider. Make sure you discuss any questions you have with your health care provider.   Document Released: 05/01/2005 Document Revised: 01/20/2015 Document Reviewed: 12/11/2012 Elsevier Interactive Patient Education 2016 [employer] Inc[employer]   Low-Fat Diet for Gallbladder Conditions A low-fat diet can be helpful if you have pancreatitis or a gallbladder condition. With these conditions, your pancreas and gallbladder have trouble digesting fats. A healthy eating plan with less fat will help rest your pancreas and gallbladder and reduce your symptoms. WHAT DO I NEED TO KNOW ABOUT THIS DIET? Eat a low-fat  diet. Reduce your fat intake to less than 20-30% of your total daily calories. This is less than 50-60 g of fat per day. Remember that you need some fat in your diet. Ask your dietician what your daily goal should be. Choose nonfat and low-fat healthy foods. Look for the words "nonfat," "low fat," or "fat free." As a guide, look on the label and choose foods with less than 3 g of fat per serving. Eat only one serving. Avoid alcohol. Do not smoke. If you need help quitting, talk with your health care provider. Eat small frequent meals instead of three large heavy meals. WHAT FOODS CAN I EAT? Grains Include healthy grains and starches such as potatoes, wheat bread, fiber-rich cereal, and brown rice. Choose whole grain options whenever possible. In adults, whole grains should account for 45-65% of your daily calories.  Fruits and Vegetables Eat plenty of fruits and vegetables. Fresh fruits and vegetables add fiber to your diet. Meats and Other Protein Sources Eat lean meat such as chicken and pork. Trim any fat off of meat before cooking it. Eggs, fish, and beans are other sources of protein. In adults, these foods should account for 10-35% of your daily calories. Dairy Choose low-fat milk and dairy options. Dairy includes fat and protein, as well as calcium.  Fats and Oils Limit high-fat foods such as fried foods, sweets, baked  goods, sugary drinks.  Other Creamy sauces and condiments, such as mayonnaise, can add extra fat. Think about whether or not you need to use them, or use smaller amounts or low fat options. WHAT FOODS ARE NOT RECOMMENDED? High fat foods, such as: [employer]. Ice cream. [place] toast. Sweet rolls. Pizza. Cheese bread. Foods covered with batter, butter, creamy sauces, or cheese. Fried foods. Sugary drinks and desserts. Foods that cause gas or bloating   This information is not intended to replace advice given to you by your health care provider. Make sure you  discuss any questions you have with your health care provider.   Document Released: 05/06/2013 Document Reviewed: 05/06/2013 Elsevier Interactive Patient Education [employer]

## 2023-11-19 NOTE — Progress Notes (Signed)
 11/19/2023  Reason for Visit:  Symptomatic cholelithiasis  History of Present Illness: Ashlee Curtis is a 43 y.o. female presenting for evaluation of symptomatic cholelithiasis.  The patient presented to the emergency room on 11/09/2023 with complaints of chest pain, back pain between her shoulder blades, and epigastric and right upper quadrant pain that started earlier the night before.  She had [employer] for dinner.  The patient reported having some nausea but denies any emesis.  In the emergency room, initial cardiac workup was negative.  She also had an ultrasound of the right upper quadrant which showed multiple gallstones measuring up to 1 cm in size but without any inflammatory changes and a normal common bile duct size.  Her LFTs were also normal except for AST was low at 14.  White blood cell, is also normal at 5.5.  Her symptoms resolved emergency room and she was able to get discharged home.  She has kept a low fat diet since.  The patient reports that now knowing more of the symptoms and looking more into what the gallbladder does not however can cause issues, she feels that over the past 2 years she has had episodes of indigestion bloatedness and upper abdominal discomfort which she is wondering could be related to her gallbladder.  Past Medical History: Past Medical History:  Diagnosis Date   Abnormal uterine bleeding (AUB)    Anemia    Dysmenorrhea    Endometriosis    Ovarian cyst      Past Surgical History: Past Surgical History:  Procedure Laterality Date   CESAREAN SECTION     4 cesareans   COLONOSCOPY WITH PROPOFOL  N/A 06/30/2022   Procedure: COLONOSCOPY WITH PROPOFOL ;  Surgeon: Maryruth Ole DASEN, MD;  Location: [facility] ENDOSCOPY;  Service: Endoscopy;  Laterality: N/A;   CYSTOSCOPY N/A 07/27/2023   Procedure: CYSTOSCOPY;  Surgeon: Verdon Keen, MD;  Location: [facility] ORS;  Service: Gynecology;  Laterality: N/A;   LAPAROSCOPIC UNILATERAL SALPINGECTOMY Left 09/06/2021    RIGHT OVARY & LEFT TUBE REMOVED Procedure: LAPAROSCOPIC UNILATERAL SALPINGECTOMY;  Surgeon: Janit Alm Agent, MD;  Location: [facility] ORS;  Service: Gynecology;  Laterality: Left;   LAPAROSCOPY N/A 09/06/2021   Procedure: LAPAROSCOPY DIAGNOSTIC;  Surgeon: Janit Alm Agent, MD;  Location: [facility] ORS;  Service: Gynecology;  Laterality: N/A;   NASAL TURBINATE REDUCTION     ROBOTIC ASSISTED LAPAROSCOPIC HYSTERECTOMY AND SALPINGECTOMY Bilateral 07/27/2023   Procedure: XI ROBOTIC ASSISTED LAPAROSCOPIC HYSTERECTOMY, EXCISION OF ENDOMETRIOSIS;  Surgeon: Verdon Keen, MD;  Location: [facility] ORS;  Service: Gynecology;  Laterality: Bilateral;   ROBOTIC ASSISTED LAPAROSCOPIC LYSIS OF ADHESION N/A 07/27/2023   Procedure: LYSIS, ADHESIONS, ROBOT-ASSISTED, LAPAROSCOPIC;  Surgeon: Verdon Keen, MD;  Location: [facility] ORS;  Service: Gynecology;  Laterality: N/A;   TONSILLECTOMY AND ADENOIDECTOMY     TUBAL LIGATION  03/2009   XI ROBOTIC LAPAROSCOPIC ASSISTED APPENDECTOMY N/A 07/27/2023   Procedure: APPENDECTOMY, ROBOT-ASSISTED, LAPAROSCOPIC;  Surgeon: Verdon Keen, MD;  Location: [facility] ORS;  Service: Gynecology;  Laterality: N/A;    Home Medications: Prior to Admission medications   Medication Sig Start Date End Date Taking? Authorizing Provider  Multiple Vitamin (MULTIVITAMIN) capsule Take 1 capsule by mouth daily.    [provider]  ondansetron  (ZOFRAN -ODT) 4 MG disintegrating tablet Allow 1-2 tablets to dissolve in your mouth every 8 hours as needed for nausea/vomiting 11/09/23   Gordan Huxley, MD  oxyCODONE  (OXY IR/ROXICODONE ) 5 MG immediate release tablet Take 1 tablet (5 mg total) by mouth every 4 (four) hours as needed for  severe pain (pain score 7-10). 07/28/23   Verdon Keen, MD    Allergies: Allergies  Allergen Reactions   Sulfa Antibiotics Hives    Social History:  reports that she has never smoked. She has never used smokeless tobacco. She reports that she does not currently use  alcohol. She reports that she does not use drugs.   Family History: History reviewed. No pertinent family history.  Review of Systems: Review of Systems  Constitutional:  Negative for chills and fever.  HENT:  Negative for hearing loss.   Respiratory:  Negative for shortness of breath.   Cardiovascular:  Negative for chest pain.  Gastrointestinal:  Positive for abdominal pain and nausea. Negative for constipation and vomiting.  Genitourinary:  Negative for dysuria.  Musculoskeletal:  Positive for back pain.  Skin:  Negative for rash.  Neurological:  Negative for dizziness.  Psychiatric/Behavioral:  Negative for depression.     Physical Exam BP 135/85   Pulse 74   Temp 98.5 F (36.9 C) (Oral)   Ht 5' 3 (1.6 m)   Wt 244 lb (110.7 kg)   LMP 07/15/2023 (Exact Date)   SpO2 99%   BMI 43.22 kg/m  CONSTITUTIONAL: No acute distress HEENT:  Normocephalic, atraumatic, extraocular motion intact. NECK: Trachea is midline, and there is no jugular venous distension.  RESPIRATORY:  Lungs are clear, and breath sounds are equal bilaterally. Normal respiratory effort without pathologic use of accessory muscles. CARDIOVASCULAR: Heart is regular without murmurs, gallops, or rubs. GI: The abdomen is soft, non-distended, with some residual soreness to palpation in the RUQ, but a negative Murphy's sign.  Prior robotic port incisions are well healed.  MUSCULOSKELETAL:  Normal muscle strength and tone in all four extremities.  No peripheral edema or cyanosis. SKIN: Skin turgor is normal. There are no pathologic skin lesions.  NEUROLOGIC:  Motor and sensation is grossly normal.  Cranial nerves are grossly intact. PSYCH:  Alert and oriented to person, place and time. Affect is normal.  Laboratory Analysis: Labs from 11/09/2023: Sodium 139, potassium 3.9, chloride 106, CO2 26, BUN 15, creatinine 0.96.  Total bilirubin 0.5, AST 14, ALT 14, alkaline phosphatase 47, albumin 3.7, lipase 37.  WBC 5.5,  hemoglobin 13, hematocrit 39, platelets 231.  Imaging: Ultrasound RUQ on 11/09/2023: IMPRESSION: 1. Multiple mobile shadowing gallstones, largest measuring 10 mm. 2. No sonographic Murphy sign. Gallbladder wall thickness within normal limits.  Assessment and Plan: This is a 43 y.o. female with symptomatic cholelithiasis.  - Discussed with patient the findings on her ultrasound and laboratory workup in the emergency room.  Overall it does appear that she came in with an episode of biliary colic and her symptoms improved and resolved in the emergency room.  Ultrasound did not show inflammatory changes and her laboratory workup was normal as well.  As such, she was able to be discharged home.  Discussed with the patient the potential treatment options ranging from conservative measures, to medications, to surgical management.  Discussed with her the potential benefits and issues with each option and after further discussion, she has opted for proceeding with surgery. - Discussed with patient and the plan for a robotic assisted cholecystectomy and reviewed the surgery at length with her including the planned incisions, risks of bleeding, infection, injury to surrounding structures, the use of ICG to better evaluate the biliary anatomy, that this would be an outpatient procedure, postoperative activity restrictions, pain control, and she is willing to proceed. - We will tentatively schedule her for surgery  this week on 11/22/2023.  If this date does not work, we can schedule her for next week.  All of her questions have been answered.  I spent 60 minutes dedicated to the care of this patient on the date of this encounter to include pre-visit review of records, face-to-face time with the patient discussing diagnosis and management, and any post-visit coordination of care.   Aloysius Sheree Plant, MD [facility]

## 2023-11-19 NOTE — Telephone Encounter (Signed)
 Patient has been advised of Pre-Admission date/time, and Surgery date at [facility].  Surgery Date: 11/22/23 Preadmission Testing Date: 11/21/23 (phone 1p-4p)  Patient informed of the scheduling process and surgery information given at time of office visit.   Patient has been made aware to call [phone], between 1-3:00pm the day before surgery, to find out what time to arrive for surgery.

## 2023-11-21 ENCOUNTER — Other Ambulatory Visit: Payer: Self-pay

## 2023-11-21 ENCOUNTER — Encounter
Admission: RE | Admit: 2023-11-21 | Discharge: 2023-11-21 | Disposition: A | Discharge status: Home / Self Care | Source: Ambulatory Visit | Attending: Surgery | Admitting: Surgery

## 2023-11-21 HISTORY — DX: Gastro-esophageal reflux disease without esophagitis: K21.9

## 2023-11-21 NOTE — Patient Instructions (Addendum)
 Your procedure is scheduled on: Thursday 11/22/23 Report to the Registration Desk on the 1st floor of the [facility]. To find out your arrival time, please call [phone] between 1PM - 3PM on:Wednesday, (today) 11/21/23 If your arrival time is 6:00 am, do not arrive before that time as the [facility] entrance doors do not open until 6:00 am.  REMEMBER: Instructions that are not followed completely may result in serious medical risk, up to and including death; or upon the discretion of your surgeon and anesthesiologist your surgery may need to be rescheduled.  Do not eat food after midnight the night before surgery.  No gum chewing or hard candies.  You may however, drink CLEAR liquids up to 2 hours before you are scheduled to arrive for your surgery. Do not drink anything within 2 hours of your scheduled arrival time.  Clear liquids include: - water   - apple juice without pulp - gatorade (not RED colors) - black coffee or tea (Do NOT add milk or creamers to the coffee or tea) Do NOT drink anything that is not on this list.   One week prior to surgery: Do not take any NSAIDs today or tomorrow morning Stop Anti-inflammatories (NSAIDS) such as Advil , Aleve, Ibuprofen , Motrin , Naproxen, Naprosyn and Aspirin based products such as Excedrin, Goody's Powder, BC Powder. Stop ALL OVER THE COUNTER supplements until after surgery.  You may however, continue to take Tylenol  if needed for pain up until the day of surgery.  Continue taking all of your other prescription medications up until the day of surgery.  ON THE DAY OF SURGERY ONLY TAKE THESE MEDICATIONS WITH SIPS OF WATER :  Oxycodone  (if needed for pain)   No Alcohol for 24 hours before or after surgery.  No Smoking including e-cigarettes for 24 hours before surgery.  No chewable tobacco products for at least 6 hours before surgery.  No nicotine patches on the day of surgery.  Do not use any recreational drugs for at least  a week (preferably 2 weeks) before your surgery.  Please be advised that the combination of cocaine and anesthesia may have negative outcomes, up to and including death. If you test positive for cocaine, your surgery will be cancelled.  On the morning of surgery brush your teeth with toothpaste and water , you may rinse your mouth with mouthwash if you wish. Do not swallow any toothpaste or mouthwash.  Use CHG Soap or wipes as directed on instruction sheet.  Do not wear jewelry, make-up, hairpins, clips or nail polish.  For welded (permanent) jewelry: bracelets, anklets, waist bands, etc.  Please have this removed prior to surgery.  If it is not removed, there is a chance that hospital personnel will need to cut it off on the day of surgery.  Do not wear lotions, powders, or perfumes.   Do not shave body hair from the neck down 48 hours before surgery.  Contact lenses, hearing aids and dentures may not be worn into surgery.  Do not bring valuables to the hospital. [facility] is not responsible for any missing/lost belongings or valuables.    Notify your doctor if there is any change in your medical condition (cold, fever, infection).  Wear comfortable clothing (specific to your surgery type) to the hospital.  After surgery, you can help prevent lung complications by doing breathing exercises.  Take deep breaths and cough every 1-2 hours. Your doctor may order a device called an Incentive Spirometer to help you take deep breaths. When  coughing or sneezing, hold a pillow firmly against your incision with both hands. This is called "splinting." Doing this helps protect your incision. It also decreases belly discomfort.  If you are being admitted to the hospital overnight, leave your suitcase in the car. After surgery it may be brought to your room.  In case of increased patient census, it may be necessary for you, the patient, to continue your postoperative care in the Same Day Surgery  department.  If you are being discharged the day of surgery, you will not be allowed to drive home. You will need a responsible individual to drive you home and stay with you for 24 hours after surgery.   If you are taking public transportation, you will need to have a responsible individual with you.  Please call the Pre-admissions Testing Dept. at [phone] if you have any questions about these instructions.  Surgery Visitation Policy:  Patients having surgery or a procedure may have two visitors.  Children under the age of 62 must have an adult with them who is not the patient.   Merchandiser, retail to address health-related social needs:  [email or web address]     Preparing for Surgery with CHLORHEXIDINE  GLUCONATE (CHG) Soap  Chlorhexidine  Gluconate (CHG) Soap  o An antiseptic cleaner that kills germs and bonds with the skin to continue killing germs even after washing  o Used for showering the night before surgery and morning of surgery  Before surgery, you can play an important role by reducing the number of germs on your skin.  CHG (Chlorhexidine  gluconate) soap is an antiseptic cleanser which kills germs and bonds with the skin to continue killing germs even after washing.  Please do not use if you have an allergy to CHG or antibacterial soaps. If your skin becomes reddened/irritated stop using the CHG.  1. Shower the NIGHT BEFORE SURGERY and the MORNING OF SURGERY with CHG soap.  2. If you choose to wash your hair, wash your hair first as usual with your normal shampoo.  3. After shampooing, rinse your hair and body thoroughly to remove the shampoo.  4. Use CHG as you would any other liquid soap. You can apply CHG directly to the skin and wash gently with a scrungie or a clean washcloth.  5. Apply the CHG soap to your body only from the neck down. Do not use on open wounds or open sores. Avoid contact with your eyes, ears, mouth, and genitals  (private parts). Wash face and genitals (private parts) with your normal soap.  6. Wash thoroughly, paying special attention to the area where your surgery will be performed.  7. Thoroughly rinse your body with warm water .  8. Do not shower/wash with your normal soap after using and rinsing off the CHG soap.  9. Pat yourself dry with a clean towel.  10. Wear clean pajamas to bed the night before surgery.  12. Place clean sheets on your bed the night of your first shower and do not sleep with pets.  13. Shower again with the CHG soap on the day of surgery prior to arriving at the hospital.  14. Do not apply any deodorants/lotions/powders.  15. Please wear clean clothes to the hospital.

## 2023-11-22 ENCOUNTER — Ambulatory Visit: Admitting: Anesthesiology

## 2023-11-22 ENCOUNTER — Other Ambulatory Visit: Payer: Self-pay

## 2023-11-22 ENCOUNTER — Ambulatory Visit
Admission: RE | Admit: 2023-11-22 | Discharge: 2023-11-22 | Disposition: A | Discharge status: Home / Self Care | Source: Ambulatory Visit | Attending: Surgery | Admitting: Surgery

## 2023-11-22 ENCOUNTER — Encounter: Payer: Self-pay | Admitting: Surgery

## 2023-11-22 ENCOUNTER — Encounter
Admission: RE | Disposition: A | Discharge status: Home / Self Care | Payer: Self-pay | Source: Ambulatory Visit | Attending: Surgery

## 2023-11-22 DIAGNOSIS — K8066 Calculus of gallbladder and bile duct with acute and chronic cholecystitis without obstruction: Secondary | ICD-10-CM | POA: Insufficient documentation

## 2023-11-22 DIAGNOSIS — K810 Acute cholecystitis: Secondary | ICD-10-CM

## 2023-11-22 DIAGNOSIS — K8020 Calculus of gallbladder without cholecystitis without obstruction: Secondary | ICD-10-CM | POA: Diagnosis present

## 2023-11-22 HISTORY — DX: Morbid (severe) obesity due to excess calories: E66.01

## 2023-11-22 HISTORY — PX: INDOCYANINE GREEN FLUORESCENCE IMAGING (ICG): SHX7595

## 2023-11-22 SURGERY — CHOLECYSTECTOMY, ROBOT-ASSISTED, LAPAROSCOPIC
Anesthesia: General

## 2023-11-22 MED ORDER — ACETAMINOPHEN 500 MG PO TABS
1000.0000 mg | ORAL_TABLET | ORAL | Status: AC
  Administered 2023-11-22: 1000 mg via ORAL

## 2023-11-22 MED ORDER — BUPIVACAINE LIPOSOME 1.3 % IJ SUSP
INTRAMUSCULAR | Status: AC
  Filled 2023-11-22: qty 20

## 2023-11-22 MED ORDER — INDOCYANINE GREEN 25 MG IV SOLR
INTRAVENOUS | Status: AC
  Filled 2023-11-22: qty 10

## 2023-11-22 MED ORDER — OXYCODONE HCL 5 MG PO TABS
5.0000 mg | ORAL_TABLET | Freq: Once | ORAL | Status: AC
  Administered 2023-11-22: 5 mg via ORAL

## 2023-11-22 MED ORDER — MIDAZOLAM HCL 2 MG/2ML IJ SOLN
INTRAMUSCULAR | Status: DC | PRN
  Administered 2023-11-22: 2 mg via INTRAVENOUS

## 2023-11-22 MED ORDER — BUPIVACAINE LIPOSOME 1.3 % IJ SUSP
INTRAMUSCULAR | Status: DC | PRN
  Administered 2023-11-22: 10 mL

## 2023-11-22 MED ORDER — CEFAZOLIN SODIUM-DEXTROSE 2-4 GM/100ML-% IV SOLN
2.0000 g | INTRAVENOUS | Status: AC
  Administered 2023-11-22: 2 g via INTRAVENOUS

## 2023-11-22 MED ORDER — AMOXICILLIN-POT CLAVULANATE 875-125 MG PO TABS: 1.0000 | ORAL_TABLET | Freq: Two times a day (BID) | ORAL | 0 refills | Status: AC

## 2023-11-22 MED ORDER — SUGAMMADEX SODIUM 200 MG/2ML IV SOLN
INTRAVENOUS | Status: DC | PRN
  Administered 2023-11-22 (×2): 200 mg via INTRAVENOUS

## 2023-11-22 MED ORDER — OXYCODONE HCL 5 MG PO TABS: 5.0000 mg | ORAL_TABLET | ORAL | 0 refills | Status: AC | PRN

## 2023-11-22 MED ORDER — GABAPENTIN 300 MG PO CAPS
300.0000 mg | ORAL_CAPSULE | ORAL | Status: AC
  Administered 2023-11-22: 300 mg via ORAL

## 2023-11-22 MED ORDER — PROPOFOL 10 MG/ML IV BOLUS
INTRAVENOUS | Status: DC | PRN
  Administered 2023-11-22: 300 mg via INTRAVENOUS

## 2023-11-22 MED ORDER — PROPOFOL 1000 MG/100ML IV EMUL
INTRAVENOUS | Status: AC
  Filled 2023-11-22: qty 100

## 2023-11-22 MED ORDER — CEFAZOLIN SODIUM-DEXTROSE 2-4 GM/100ML-% IV SOLN
INTRAVENOUS | Status: AC
  Filled 2023-11-22: qty 100

## 2023-11-22 MED ORDER — DEXMEDETOMIDINE HCL IN NACL 80 MCG/20ML IV SOLN
INTRAVENOUS | Status: DC | PRN
  Administered 2023-11-22 (×2): 12 ug via INTRAVENOUS
  Administered 2023-11-22: 8 ug via INTRAVENOUS

## 2023-11-22 MED ORDER — ACETAMINOPHEN 500 MG PO TABS
ORAL_TABLET | ORAL | Status: AC
  Filled 2023-11-22: qty 2

## 2023-11-22 MED ORDER — KETAMINE HCL 10 MG/ML IJ SOLN
INTRAMUSCULAR | Status: DC | PRN
Start: 2023-11-22 — End: 2023-11-22
  Administered 2023-11-22 (×3): 10 mg via INTRAVENOUS
  Administered 2023-11-22: 20 mg via INTRAVENOUS

## 2023-11-22 MED ORDER — CHLORHEXIDINE GLUCONATE CLOTH 2 % EX PADS: 6.0000 | MEDICATED_PAD | Freq: Once | CUTANEOUS | Status: DC

## 2023-11-22 MED ORDER — INDOCYANINE GREEN 25 MG IV SOLR
1.2500 mg | INTRAVENOUS | Status: AC
  Administered 2023-11-22: 1.25 mg via INTRAVENOUS

## 2023-11-22 MED ORDER — GLYCOPYRROLATE 0.2 MG/ML IJ SOLN
INTRAMUSCULAR | Status: DC | PRN
  Administered 2023-11-22: .2 mg via INTRAVENOUS

## 2023-11-22 MED ORDER — MIDAZOLAM HCL 2 MG/2ML IJ SOLN
INTRAMUSCULAR | Status: AC
  Filled 2023-11-22: qty 2

## 2023-11-22 MED ORDER — BUPIVACAINE LIPOSOME 1.3 % IJ SUSP
INTRAMUSCULAR | Status: AC
Start: 2023-11-22 — End: 2023-11-22
  Filled 2023-11-22: qty 10

## 2023-11-22 MED ORDER — HYDROMORPHONE HCL 1 MG/ML IJ SOLN
INTRAMUSCULAR | Status: AC
  Filled 2023-11-22: qty 1

## 2023-11-22 MED ORDER — GABAPENTIN 300 MG PO CAPS
ORAL_CAPSULE | ORAL | Status: AC
  Filled 2023-11-22: qty 1

## 2023-11-22 MED ORDER — BUPIVACAINE LIPOSOME 1.3 % IJ SUSP: 10.0000 mL | Freq: Once | INTRAMUSCULAR | Status: DC

## 2023-11-22 MED ORDER — OXYCODONE HCL 5 MG PO TABS
ORAL_TABLET | ORAL | Status: AC
  Filled 2023-11-22: qty 1

## 2023-11-22 MED ORDER — ACETAMINOPHEN 500 MG PO TABS: 1000.0000 mg | ORAL_TABLET | Freq: Four times a day (QID) | ORAL | Status: AC | PRN

## 2023-11-22 MED ORDER — FENTANYL CITRATE (PF) 100 MCG/2ML IJ SOLN
INTRAMUSCULAR | Status: DC | PRN
  Administered 2023-11-22: 100 ug via INTRAVENOUS

## 2023-11-22 MED ORDER — ORAL CARE MOUTH RINSE: 15.0000 mL | Freq: Once | OROMUCOSAL | Status: AC

## 2023-11-22 MED ORDER — HYDROMORPHONE HCL 1 MG/ML IJ SOLN
0.5000 mg | INTRAMUSCULAR | Status: DC | PRN
  Administered 2023-11-22 (×2): 0.5 mg via INTRAVENOUS

## 2023-11-22 MED ORDER — FENTANYL CITRATE (PF) 100 MCG/2ML IJ SOLN
INTRAMUSCULAR | Status: AC
  Filled 2023-11-22: qty 2

## 2023-11-22 MED ORDER — CHLORHEXIDINE GLUCONATE 0.12 % MT SOLN
OROMUCOSAL | Status: AC
  Filled 2023-11-22: qty 15

## 2023-11-22 MED ORDER — BUPIVACAINE-EPINEPHRINE (PF) 0.25% -1:200000 IJ SOLN
INTRAMUSCULAR | Status: DC | PRN
Start: 2023-11-22 — End: 2023-11-22
  Administered 2023-11-22: 30 mL

## 2023-11-22 MED ORDER — LIDOCAINE HCL (CARDIAC) PF 100 MG/5ML IV SOSY
PREFILLED_SYRINGE | INTRAVENOUS | Status: DC | PRN
  Administered 2023-11-22: 100 mg via INTRAVENOUS

## 2023-11-22 MED ORDER — ONDANSETRON HCL 4 MG/2ML IJ SOLN
INTRAMUSCULAR | Status: DC | PRN
  Administered 2023-11-22: 4 mg via INTRAVENOUS

## 2023-11-22 MED ORDER — KETAMINE HCL 50 MG/5ML IJ SOSY
PREFILLED_SYRINGE | INTRAMUSCULAR | Status: AC
Start: 2023-11-22 — End: 2023-11-22
  Filled 2023-11-22: qty 5

## 2023-11-22 MED ORDER — BUPIVACAINE-EPINEPHRINE (PF) 0.25% -1:200000 IJ SOLN
INTRAMUSCULAR | Status: AC
  Filled 2023-11-22: qty 30

## 2023-11-22 MED ORDER — LACTATED RINGERS IV SOLN: INTRAVENOUS | Status: DC

## 2023-11-22 MED ORDER — FENTANYL CITRATE PF 50 MCG/ML IJ SOSY: 50.0000 ug | PREFILLED_SYRINGE | INTRAMUSCULAR | Status: DC | PRN

## 2023-11-22 MED ORDER — IBUPROFEN 600 MG PO TABS: 600.0000 mg | ORAL_TABLET | Freq: Three times a day (TID) | ORAL | 1 refills | Status: AC | PRN

## 2023-11-22 MED ORDER — CHLORHEXIDINE GLUCONATE 0.12 % MT SOLN
15.0000 mL | Freq: Once | OROMUCOSAL | Status: AC
  Administered 2023-11-22: 15 mL via OROMUCOSAL

## 2023-11-22 MED ORDER — EPHEDRINE SULFATE-NACL 50-0.9 MG/10ML-% IV SOSY
PREFILLED_SYRINGE | INTRAVENOUS | Status: DC | PRN
  Administered 2023-11-22 (×2): 10 mg via INTRAVENOUS

## 2023-11-22 MED ORDER — GLYCOPYRROLATE 0.2 MG/ML IJ SOLN
INTRAMUSCULAR | Status: AC
  Filled 2023-11-22: qty 1

## 2023-11-22 MED ORDER — ROCURONIUM BROMIDE 100 MG/10ML IV SOLN
INTRAVENOUS | Status: DC | PRN
  Administered 2023-11-22: 10 mg via INTRAVENOUS
  Administered 2023-11-22: 50 mg via INTRAVENOUS

## 2023-11-22 SURGICAL SUPPLY — 39 items
BAG PRESSURE INF REUSE 1000 (BAG) IMPLANT
CANNULA CAP OBTURATR AIRSEAL 8 (CAP) IMPLANT
CAUTERY HOOK MNPLR 1.6 DVNC XI (INSTRUMENTS) ×2 IMPLANT
CLIP LIGATING HEMO O LOK GREEN (MISCELLANEOUS) ×2 IMPLANT
DERMABOND ADVANCED .7 DNX12 (GAUZE/BANDAGES/DRESSINGS) ×2 IMPLANT
DRAPE ARM DVNC X/XI (DISPOSABLE) ×8 IMPLANT
DRAPE COLUMN DVNC XI (DISPOSABLE) ×2 IMPLANT
ELECTRODE CAUTERY BLDE TIP 2.5 (TIP) ×2 IMPLANT
ELECTRODE REM PT RTRN 9FT ADLT (ELECTROSURGICAL) ×2 IMPLANT
FORCEPS BPLR R/ABLATION 8 DVNC (INSTRUMENTS) ×2 IMPLANT
FORCEPS PROGRASP DVNC XI (FORCEP) ×2 IMPLANT
GLOVE SURG SYN 7.0 (GLOVE) ×10 IMPLANT
GLOVE SURG SYN 7.0 PF PI (GLOVE) ×4 IMPLANT
GLOVE SURG SYN 7.5 E (GLOVE) ×10 IMPLANT
GLOVE SURG SYN 7.5 PF PI (GLOVE) ×4 IMPLANT
GOWN STRL REUS W/ TWL LRG LVL3 (GOWN DISPOSABLE) ×8 IMPLANT
IRRIGATOR SUCT 8 DISP DVNC XI (IRRIGATION / IRRIGATOR) IMPLANT
IV NS 1000ML BAXH (IV SOLUTION) IMPLANT
KIT PINK PAD W/HEAD ARM REST (MISCELLANEOUS) ×2 IMPLANT
LABEL OR SOLS (LABEL) ×2 IMPLANT
MANIFOLD NEPTUNE II (INSTRUMENTS) ×2 IMPLANT
NDL HYPO 22X1.5 SAFETY MO (MISCELLANEOUS) ×2 IMPLANT
NEEDLE HYPO 22X1.5 SAFETY MO (MISCELLANEOUS) ×2 IMPLANT
NS IRRIG 500ML POUR BTL (IV SOLUTION) ×2 IMPLANT
OBTURATOR OPTICALSTD 8 DVNC (TROCAR) ×2 IMPLANT
PACK LAP CHOLECYSTECTOMY (MISCELLANEOUS) ×2 IMPLANT
RELOAD STAPLE 45 3.5 BLU DVNC (STAPLE) IMPLANT
RELOAD STAPLER 3.5X45 BLU DVNC (STAPLE) ×2 IMPLANT
SEAL UNIV 5-12 XI (MISCELLANEOUS) ×8 IMPLANT
SET TUBE FILTERED XL AIRSEAL (SET/KITS/TRAYS/PACK) IMPLANT
SET TUBE SMOKE EVAC HIGH FLOW (TUBING) ×2 IMPLANT
SOLUTION ELECTROSURG ANTI STCK (MISCELLANEOUS) ×2 IMPLANT
SPIKE FLUID TRANSFER (MISCELLANEOUS) ×2 IMPLANT
STAPLER 45 SUREFORM DVNC (STAPLE) IMPLANT
SUT MNCRL AB 4-0 PS2 18 (SUTURE) ×2 IMPLANT
SUT VIC AB 3-0 SH 27X BRD (SUTURE) IMPLANT
SUT VICRYL 0 UR6 27IN ABS (SUTURE) ×4 IMPLANT
SYSTEM BAG RETRIEVAL 10MM (BASKET) ×2 IMPLANT
WATER STERILE IRR 500ML POUR (IV SOLUTION) ×2 IMPLANT

## 2023-11-22 NOTE — Anesthesia Postprocedure Evaluation (Signed)
 Anesthesia Post Note  Patient: DANILYNN JEMISON  Procedure(s) Performed: CHOLECYSTECTOMY, ROBOT-ASSISTED, LAPAROSCOPIC INDOCYANINE GREEN  FLUORESCENCE IMAGING (ICG)  Patient location during evaluation: PACU Anesthesia Type: General Level of consciousness: awake and alert Pain management: pain level controlled Vital Signs Assessment: post-procedure vital signs reviewed and stable Respiratory status: spontaneous breathing, nonlabored ventilation, respiratory function stable and patient connected to nasal cannula oxygen Cardiovascular status: blood pressure returned to baseline and stable Postop Assessment: no apparent nausea or vomiting Anesthetic complications: no   There were no known notable events for this encounter.   Last Vitals:  Vitals:   11/22/23 1430 11/22/23 1502  BP: (!) 151/77 (!) 147/75  Pulse: 66 74  Resp: 15 14  Temp: 36.5 C   SpO2: 100% 99%    Last Pain:  Vitals:   11/22/23 1502  PainSc: 7                  Zaylia Riolo C Frankye Schwegel

## 2023-11-22 NOTE — Transfer of Care (Signed)
 Immediate Anesthesia Transfer of Care Note  Patient: Ashlee Curtis  Procedure(s) Performed: CHOLECYSTECTOMY, ROBOT-ASSISTED, LAPAROSCOPIC INDOCYANINE GREEN  FLUORESCENCE IMAGING (ICG)  Patient Location: PACU  Anesthesia Type:General  Level of Consciousness: drowsy and patient cooperative  Airway & Oxygen Therapy: Patient Spontanous Breathing and Patient connected to face mask oxygen  Post-op Assessment: Report given to RN and Post -op Vital signs reviewed and stable  Post vital signs: stable  Last Vitals:  Vitals Value Taken Time  BP 148/72 11/22/23 13:52  Temp 36.2 C 11/22/23 13:53  Pulse 79 11/22/23 13:54  Resp 22 11/22/23 13:54  SpO2 100 % 11/22/23 13:54  Vitals shown include unfiled device data.  Last Pain:  Vitals:   11/22/23 0945  PainSc: 0-No pain         Complications: No notable events documented.

## 2023-11-22 NOTE — Interval H&P Note (Signed)
 History and Physical Interval Note:  11/22/2023 11:35 AM  Ashlee Curtis  has presented today for surgery, with the diagnosis of Symptomatic cholelithiasis.  The various methods of treatment have been discussed with the patient and family. After consideration of risks, benefits and other options for treatment, the patient has consented to  Procedure(s) with comments: CHOLECYSTECTOMY, ROBOT-ASSISTED, LAPAROSCOPIC (N/A) - w/ICG INDOCYANINE GREEN  FLUORESCENCE IMAGING (ICG) as a surgical intervention.  The patient's history has been reviewed, patient examined, no change in status, stable for surgery.  I have reviewed the patient's chart and labs.  Questions were answered to the patient's satisfaction.     Talin Feister

## 2023-11-22 NOTE — Discharge Instructions (Addendum)

## 2023-11-22 NOTE — Anesthesia Procedure Notes (Signed)
 Procedure Name: Intubation Date/Time: 11/22/2023 11:59 AM  Performed by: Norleen Alberta HERO., CRNAPre-anesthesia Checklist: Patient identified, Patient being monitored, Timeout performed, Emergency Drugs available and Suction available Patient Re-evaluated:Patient Re-evaluated prior to induction Oxygen Delivery Method: Circle system utilized Preoxygenation: Pre-oxygenation with 100% oxygen Induction Type: IV induction Ventilation: Mask ventilation without difficulty Laryngoscope Size: 3 and McGrath Grade View: Grade I Tube type: Oral Tube size: 6.5 mm Number of attempts: 1 Airway Equipment and Method: Stylet Placement Confirmation: ETT inserted through vocal cords under direct vision, positive ETCO2 and breath sounds checked- equal and bilateral Secured at: 19 cm Tube secured with: Tape Dental Injury: Teeth and Oropharynx as per pre-operative assessment

## 2023-11-22 NOTE — Op Note (Signed)
  Procedure Date:  11/22/2023  Pre-operative Diagnosis:  Symptomatic cholelithiasis  Post-operative Diagnosis: Acute cholecystitis  Procedure:  Robotic assisted cholecystectomy with ICG FireFly cholangiogram  Surgeon:  Aloysius Sheree Plant, MD  Anesthesia:  General endotracheal  Estimated Blood Loss:  100 ml  Specimens:  gallbladder  Complications:  None  Indications for Procedure:  This is a 43 y.o. female who presents with abdominal pain and workup revealing symptomatic cholelithiasis.  The benefits, complications, treatment options, and expected outcomes were discussed with the patient. The risks of bleeding, infection, recurrence of symptoms, failure to resolve symptoms, bile duct damage, bile duct leak, retained common bile duct stone, bowel injury, and need for further procedures were all discussed with the patient and she was willing to proceed.  Description of Procedure: The patient was correctly identified in the preoperative area and brought into the operating room.  The patient was placed supine with VTE prophylaxis in place.  Appropriate time-outs were performed.  Anesthesia was induced and the patient was intubated.  Appropriate antibiotics were infused.  The abdomen was prepped and draped in a sterile fashion. An infraumbilical incision was made. A cutdown technique was used to enter the abdominal cavity without injury, and a 12 mm robotic port was inserted.  Pneumoperitoneum was obtained with appropriate opening pressures.  Three 8-mm ports were placed in the mid abdomen at the level of the umbilicus under direct visualization.  The DaVinci platform was docked, camera targeted, and instruments were placed under direct visualization.  The gallbladder was identified.  The fundus was grasped and retracted cephalad.  Adhesions were lysed bluntly and with electrocautery. The infundibulum was grasped and retracted laterally, exposing the peritoneum overlying the gallbladder.  This was  incised with electrocautery and extended on either side of the gallbladder.  The cystic duct appeared very dilated and curled towards the neck of the gallbladder.  FireFly cholangiogram was then obtained, and we were able to clearly identify the cystic duct and common bile duct.  The cystic duct and was carefully dissected with combination of cautery and blunt dissection.  While doing so, what appeared to be a posterior branch of cystic artery was encountered and cauterized, but then siginifant bleeding came from the area.  This was eventually controlled with bipolar cautery, but we had about 100 ml of blood loss from this.  Given that the cystic duct was dilated, it was decided to take it down using a 45 mm blue load stapler.  The gallbladder was taken from the gallbladder fossa in a retrograde fashion with electrocautery. The gallbladder was placed in an Endocatch bag. The liver bed was inspected and any bleeding was controlled with electrocautery. The right upper quadrant was then inspected again revealing intact clips, no bleeding, and no ductal injury.  The area was thoroughly irrigated.  The 8 mm ports were removed under direct visualization and the 12 mm port was removed.  The Endocatch bag was brought out via the umbilical incision. The fascial opening was closed using 0 vicryl suture.  Local anesthetic was infused in all incisions and the incisions were closed with 4-0 Monocryl.  The wounds were cleaned and sealed with DermaBond.  The patient was emerged from anesthesia and extubated and brought to the recovery room for further management.  The patient tolerated the procedure well and all counts were correct at the end of the case.   Aloysius Sheree Plant, MD

## 2023-11-22 NOTE — Anesthesia Preprocedure Evaluation (Addendum)
 Anesthesia Evaluation  Patient identified by MRN, date of birth, ID band Patient awake    Reviewed: Allergy & Precautions, H&P , NPO status , Patient's Chart, lab work & pertinent test results  Airway Mallampati: III  TM Distance: >3 FB Neck ROM: Full    Dental no notable dental hx. (+) Poor Dentition, Chipped, Missing Very poor dentition, chipped and cracked upper central incisors and other teeth:   Pulmonary neg pulmonary ROS   Pulmonary exam normal breath sounds clear to auscultation       Cardiovascular negative cardio ROS Normal cardiovascular exam Rhythm:Regular Rate:Normal     Neuro/Psych negative neurological ROS  negative psych ROS   GI/Hepatic negative GI ROS, Neg liver ROS,,,  Endo/Other  negative endocrine ROS    Renal/GU negative Renal ROS  negative genitourinary   Musculoskeletal negative musculoskeletal ROS (+)    Abdominal   Peds negative pediatric ROS (+)  Hematology negative hematology ROS (+)   Anesthesia Other Findings 07-27-23 hysterectomy  Endometriosis  Anemia  GERD (gastroesophageal reflux disease) Symptomatic cholelithiasis  Morbid obesity  Extremely slow to awaken from general anesthesia.  Had hysterectomy and appendectomy, 6 hour surgery, states didn't awaken from 7:30 surgery til 2300.   States becomes anxious when she goes into OR, and starts shaking and crying Denies PONV   Reproductive/Obstetrics negative OB ROS                              Anesthesia Physical Anesthesia Plan  ASA: 2  Anesthesia Plan: General ETT   Post-op Pain Management:    Induction: Intravenous  PONV Risk Score and Plan:   Airway Management Planned: Oral ETT  Additional Equipment:   Intra-op Plan:   Post-operative Plan: Extubation in OR  Informed Consent: I have reviewed the patients History and Physical, chart, labs and discussed the procedure including the  risks, benefits and alternatives for the proposed anesthesia with the patient or authorized representative who has indicated his/her understanding and acceptance.     Dental Advisory Given  Plan Discussed with: Anesthesiologist, CRNA and Surgeon  Anesthesia Plan Comments: (Patient consented for risks of anesthesia including but not limited to:  - adverse reactions to medications - damage to eyes, teeth, lips or other oral mucosa - nerve damage due to positioning  - sore throat or hoarseness - Damage to heart, brain, nerves, lungs, other parts of body or loss of life  Patient voiced understanding and assent.)         Anesthesia Quick Evaluation

## 2023-11-23 ENCOUNTER — Encounter: Payer: Self-pay | Admitting: Surgery

## 2023-11-23 LAB — SURGICAL PATHOLOGY

## 2023-11-26 ENCOUNTER — Ambulatory Visit: Admitting: Surgery

## 2023-12-05 ENCOUNTER — Encounter: Payer: Self-pay | Admitting: Surgery

## 2023-12-05 ENCOUNTER — Ambulatory Visit (INDEPENDENT_AMBULATORY_CARE_PROVIDER_SITE_OTHER): Admitting: Surgery

## 2023-12-05 VITALS — BP 136/83 | HR 67 | Temp 98.5°F | Ht 63.0 in | Wt 244.6 lb

## 2023-12-05 DIAGNOSIS — Z09 Encounter for follow-up examination after completed treatment for conditions other than malignant neoplasm: Secondary | ICD-10-CM

## 2023-12-05 DIAGNOSIS — K810 Acute cholecystitis: Secondary | ICD-10-CM

## 2023-12-05 NOTE — Patient Instructions (Signed)

## 2023-12-05 NOTE — Progress Notes (Signed)
 12/05/2023  HPI: ILINA XU is a 43 y.o. female s/p robotic assisted cholecystectomy on 11/22/23.  Gallbladder was very inflamed, so she was discharged with Augmentin  course.  She reports that she had a rash and itching at the incisions, possibly reaction to the DermaBond.  Denies any nausea, vomiting, and is tolerating regular diet.   Vital signs: BP 136/83   Pulse 67   Temp 98.5 F (36.9 C) (Oral)   Ht 5' 3 (1.6 m)   Wt 244 lb 9.6 oz (110.9 kg)   LMP 07/15/2023 (Exact Date)   SpO2 99%   BMI 43.33 kg/m    Physical Exam: Constitutional:  No acute distress Abdomen:  soft, non-distended, non-tender.  Incisions clean, dry, intact, with mild erythema that is non-blanching.  Assessment/Plan: This is a 43 y.o. female s/p robotic cholecystectomy  --Discussed with the patient again the findings in the OR with an inflamed gallbladder.  She is recovering well and denies any pain or diet issues.  She does report a rash at the incisions likely from DermaBond.  She has taken benadryl  which has helped. --Continue activity restrictions --Follow up as needed.   Aloysius Sheree Plant, MD [facility]

## 2024-01-23 ENCOUNTER — Other Ambulatory Visit: Payer: Self-pay

## 2024-01-23 DIAGNOSIS — Z1231 Encounter for screening mammogram for malignant neoplasm of breast: Secondary | ICD-10-CM

## 2024-02-05 ENCOUNTER — Ambulatory Visit

## 2024-02-05 DIAGNOSIS — Z23 Encounter for immunization: Secondary | ICD-10-CM

## 2024-02-05 NOTE — Patient Instructions (Signed)
 Influenza vaccine administered by S. Cooper CMA without difficulty

## 2024-02-28 ENCOUNTER — Ambulatory Visit

## 2024-03-31 ENCOUNTER — Ambulatory Visit
Admission: RE | Admit: 2024-03-31 | Discharge: 2024-03-31 | Disposition: A | Discharge status: Home / Self Care | Source: Ambulatory Visit

## 2024-03-31 DIAGNOSIS — Z1231 Encounter for screening mammogram for malignant neoplasm of breast: Secondary | ICD-10-CM | POA: Diagnosis present
